# Patient Record
Sex: Male | Born: 1955 | Race: White | Hispanic: No | Marital: Married | Smoking: Former smoker
Health system: Southern US, Community
[De-identification: ages and names within clinical notes are randomized; demographics above are authoritative.]

## PROBLEM LIST (undated history)

## (undated) DIAGNOSIS — I10 Essential (primary) hypertension: Secondary | ICD-10-CM

## (undated) HISTORY — PX: COLON SURGERY: SHX602

---

## 2000-02-04 ENCOUNTER — Emergency Department (HOSPITAL_COMMUNITY): Admission: EM | Admit: 2000-02-04 | Discharge: 2000-02-04 | Payer: Self-pay | Admitting: Emergency Medicine

## 2000-02-04 ENCOUNTER — Encounter: Payer: Self-pay | Admitting: Emergency Medicine

## 2000-02-05 ENCOUNTER — Emergency Department (HOSPITAL_COMMUNITY): Admission: EM | Admit: 2000-02-05 | Discharge: 2000-02-05 | Payer: Self-pay | Admitting: Emergency Medicine

## 2015-11-24 ENCOUNTER — Ambulatory Visit: Payer: Self-pay

## 2015-11-24 ENCOUNTER — Other Ambulatory Visit: Payer: Self-pay | Admitting: Occupational Medicine

## 2015-11-24 DIAGNOSIS — M25512 Pain in left shoulder: Secondary | ICD-10-CM

## 2016-06-09 ENCOUNTER — Encounter: Payer: Self-pay | Admitting: Physician Assistant

## 2017-01-16 ENCOUNTER — Other Ambulatory Visit: Payer: Self-pay | Admitting: Urology

## 2017-01-16 DIAGNOSIS — R972 Elevated prostate specific antigen [PSA]: Secondary | ICD-10-CM

## 2017-02-03 ENCOUNTER — Ambulatory Visit
Admission: RE | Admit: 2017-02-03 | Discharge: 2017-02-03 | Disposition: A | Discharge status: Home / Self Care | Payer: Self-pay | Source: Ambulatory Visit | Attending: Urology | Admitting: Urology

## 2017-02-03 DIAGNOSIS — R972 Elevated prostate specific antigen [PSA]: Secondary | ICD-10-CM

## 2017-02-03 MED ORDER — GADOBENATE DIMEGLUMINE 529 MG/ML IV SOLN
20.0000 mL | Freq: Once | INTRAVENOUS | Status: AC | PRN
  Administered 2017-02-03: 20 mL via INTRAVENOUS

## 2017-02-07 ENCOUNTER — Other Ambulatory Visit: Payer: Self-pay | Admitting: Urology

## 2017-02-07 DIAGNOSIS — N4289 Other specified disorders of prostate: Secondary | ICD-10-CM

## 2017-02-15 ENCOUNTER — Ambulatory Visit
Admission: RE | Admit: 2017-02-15 | Discharge: 2017-02-15 | Disposition: A | Discharge status: Home / Self Care | Payer: BLUE CROSS/BLUE SHIELD | Source: Ambulatory Visit | Attending: Urology | Admitting: Urology

## 2017-02-15 ENCOUNTER — Other Ambulatory Visit (HOSPITAL_COMMUNITY): Payer: Self-pay | Admitting: Interventional Radiology

## 2017-02-15 DIAGNOSIS — N4289 Other specified disorders of prostate: Secondary | ICD-10-CM

## 2017-02-15 NOTE — H&P (Signed)
Chief Complaint: Patient was seen in consultation today for prostate lesion at the request of Eskridge,Matthew  Referring Physician(s): Eskridge,Matthew  Supervising Physician: Daryll Brod  History of Present Illness: Erik Turner is a 61 y.o. male with history of Abdominoperineal resection for rectal polyps.  MR of the prostate was obtained to evaluate for elevated PSA level.  This revealed 2.8 cm left lateral peripheral zone mass in the mid gland and base, with gross extracapsular extension, left neurovascular bundle nvolvement, and possible involvement of the left seminal vesicle. (PI-RADS category 5).  He is here today for evaluation for image guided biopsy.  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  No past medical history on file.  No past surgical history on file.  Allergies: Penicillins  Medications: Prior to Admission medications   Not on File     No family history on file.  Social History   Social History  . Marital status: Divorced    Spouse name: N/A  . Number of children: N/A  . Years of education: N/A   Social History Main Topics  . Smoking status: Not on file  . Smokeless tobacco: Not on file  . Alcohol use Not on file  . Drug use: Unknown  . Sexual activity: Not on file   Other Topics Concern  . Not on file   Social History Narrative  . No narrative on file    Review of Systems: A 12 point ROS discussed  Review of Systems  Constitutional: Negative.   HENT: Negative.   Respiratory: Negative.   Gastrointestinal: Negative.   Genitourinary: Negative.   Musculoskeletal: Negative.   Skin: Negative.   Neurological: Negative.   Hematological: Negative.   Psychiatric/Behavioral: Negative.     Vital Signs: BP 133/70 (BP Location: Left Arm, Patient Position: Sitting, Cuff Size: Large)   Pulse 76   Temp 97.8 F (36.6 C) (Oral)   Resp 14   Ht 5\' 6"  (1.676 m)   Wt 265 lb (120.2 kg)   SpO2  99%   BMI 42.77 kg/m   Physical Exam  Constitutional: He is oriented to person, place, and time. He appears well-developed.  HENT:  Head: Normocephalic and atraumatic.  Eyes: EOM are normal.  Neck: Normal range of motion.  Cardiovascular: Normal rate, regular rhythm and normal heart sounds.   Pulmonary/Chest: Effort normal and breath sounds normal. No respiratory distress. He has no wheezes.  Abdominal: Soft. He exhibits no distension. There is no tenderness.  Ileostomy  Musculoskeletal: Normal range of motion.  Neurological: He is alert and oriented to person, place, and time.  Skin: Skin is warm and dry.  Psychiatric: He has a normal mood and affect. His behavior is normal. Judgment and thought content normal.  Vitals reviewed.   Imaging: Mr Prostate W Wo Contrast  Result Date: 02/05/2017 CLINICAL DATA:  Elevated PSA level. Creatinine was obtained on site at [facility] at [address]Results: Creatinine 1.0 mg/dL. EXAM: MR PROSTATE WITHOUT AND WITH CONTRAST TECHNIQUE: Multiplanar multisequence MRI images were obtained of the pelvis centered about the prostate. Pre and post contrast images were obtained. CONTRAST:  81mL MULTIHANCE GADOBENATE DIMEGLUMINE 529 MG/ML IV SOLN COMPARISON:  None. FINDINGS: Prostate: Irregular T2 hypointense mass is seen in the left lateral peripheral zone involving the mid gland and base which measures 2.8 x 2.3 x 1.8 cm. This mass shows marked ADC hypointensity and early arterial phase enhancement. Volume: 5.4 x 4.6  x 4.4 cm (volume = 57 cm^3) Transcapsular spread: Yes, along the left posterolateral base and mid gland, which abuts the levator ani. Seminal vesicle involvement: Abuts the left anterior seminal vesicle, with possible involvement Neurovascular bundle involvement: Yes, involvement of left neurovascular bundle Pelvic adenopathy: None Bone metastasis: None Other findings: Left lower quadrant colostomy IMPRESSION: 2.8 cm left lateral  peripheral zone mass in the mid gland and base, with gross extracapsular extension, left neurovascular bundle involvement, and possible involvement of the left seminal vesicle. (PI-RADS category 5). No evidence of pelvic lymphadenopathy or bone metastases. Electronically Signed   By: Earle Gell M.D.   On: 02/05/2017 08:25    Labs:  CBC: No results for input(s): WBC, HGB, HCT, PLT in the last 8760 hours.  COAGS: No results for input(s): INR, APTT in the last 8760 hours.  BMP: No results for input(s): NA, K, CL, CO2, GLUCOSE, BUN, CALCIUM, CREATININE, GFRNONAA, GFRAA in the last 8760 hours.  Invalid input(s): CMP  LIVER FUNCTION TESTS: No results for input(s): BILITOT, AST, ALT, ALKPHOS, PROT, ALBUMIN in the last 8760 hours.  TUMOR MARKERS: No results for input(s): AFPTM, CEA, CA199, CHROMGRNA in the last 8760 hours.  Assessment:  Patient with history of APR for rectal polyps and a prostate lesion = 2.8 cm left lateral peripheral zone mass in the mid gland and base, with gross extracapsular extension, left neurovascular bundle nvolvement, and possible involvement of the left seminal vesicle.  Not a candidate for traditional trans-rectal biopsy by Urology  Will proceed with image guided biopsy.  Dr. Annamaria Boots discussed Risks and Benefits of the biopsy with the patient including, but not limited to bleeding, infection, damage to adjacent structures or low yield requiring additional tests.  All of the patient's questions were answered, patient is agreeable to proceed.  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  Electronically Signed: Murrell Redden PA-C 02/15/2017, 3:13 PM   Please refer to Dr. Fritz Pickerel attestation of this note for management and plan.

## 2017-02-27 ENCOUNTER — Encounter (HOSPITAL_COMMUNITY): Payer: Self-pay

## 2017-02-27 ENCOUNTER — Ambulatory Visit (HOSPITAL_COMMUNITY): Payer: BLUE CROSS/BLUE SHIELD

## 2017-02-28 ENCOUNTER — Other Ambulatory Visit: Payer: Self-pay | Admitting: Radiology

## 2017-03-01 ENCOUNTER — Other Ambulatory Visit: Payer: Self-pay | Admitting: General Surgery

## 2017-03-01 ENCOUNTER — Other Ambulatory Visit: Payer: Self-pay | Admitting: Student

## 2017-03-01 ENCOUNTER — Other Ambulatory Visit: Payer: Self-pay | Admitting: Radiology

## 2017-03-02 ENCOUNTER — Ambulatory Visit (HOSPITAL_COMMUNITY)
Admission: RE | Admit: 2017-03-02 | Discharge: 2017-03-02 | Disposition: A | Discharge status: Home / Self Care | Payer: BLUE CROSS/BLUE SHIELD | Source: Ambulatory Visit | Attending: Interventional Radiology | Admitting: Interventional Radiology

## 2017-03-02 ENCOUNTER — Encounter (HOSPITAL_COMMUNITY): Payer: Self-pay

## 2017-03-02 DIAGNOSIS — C61 Malignant neoplasm of prostate: Secondary | ICD-10-CM | POA: Diagnosis not present

## 2017-03-02 DIAGNOSIS — Z79899 Other long term (current) drug therapy: Secondary | ICD-10-CM | POA: Insufficient documentation

## 2017-03-02 DIAGNOSIS — N4289 Other specified disorders of prostate: Secondary | ICD-10-CM

## 2017-03-02 DIAGNOSIS — I10 Essential (primary) hypertension: Secondary | ICD-10-CM | POA: Diagnosis not present

## 2017-03-02 DIAGNOSIS — Z880 Allergy status to penicillin: Secondary | ICD-10-CM | POA: Insufficient documentation

## 2017-03-02 DIAGNOSIS — Z7982 Long term (current) use of aspirin: Secondary | ICD-10-CM | POA: Diagnosis not present

## 2017-03-02 DIAGNOSIS — Z932 Ileostomy status: Secondary | ICD-10-CM | POA: Insufficient documentation

## 2017-03-02 DIAGNOSIS — N429 Disorder of prostate, unspecified: Secondary | ICD-10-CM | POA: Diagnosis present

## 2017-03-02 HISTORY — DX: Essential (primary) hypertension: I10

## 2017-03-02 LAB — BASIC METABOLIC PANEL
Anion gap: 7 (ref 5–15)
BUN: 18 mg/dL (ref 6–20)
CALCIUM: 9.2 mg/dL (ref 8.9–10.3)
CO2: 27 mmol/L (ref 22–32)
Chloride: 106 mmol/L (ref 101–111)
Creatinine, Ser: 0.86 mg/dL (ref 0.61–1.24)
GFR calc Af Amer: >60 mL/min (ref >60)
Glucose, Bld: 110 mg/dL — ABNORMAL HIGH (ref 65–99)
POTASSIUM: 3.4 mmol/L — AB (ref 3.5–5.1)
SODIUM: 140 mmol/L (ref 135–145)

## 2017-03-02 LAB — CBC WITH DIFFERENTIAL/PLATELET
BASOS ABS: 0 10*3/uL (ref 0.0–0.1)
BASOS PCT: 0 %
EOS ABS: 0.1 10*3/uL (ref 0.0–0.7)
EOS PCT: 1 %
HCT: 44.3 % (ref 39.0–52.0)
Hemoglobin: 15.5 g/dL (ref 13.0–17.0)
LYMPHS PCT: 26 %
Lymphs Abs: 2.5 10*3/uL (ref 0.7–4.0)
MCH: 29.4 pg (ref 26.0–34.0)
MCHC: 35 g/dL (ref 30.0–36.0)
MCV: 84.1 fL (ref 78.0–100.0)
Monocytes Absolute: 0.7 10*3/uL (ref 0.1–1.0)
Monocytes Relative: 8 %
Neutro Abs: 6.2 10*3/uL (ref 1.7–7.7)
Neutrophils Relative %: 65 %
PLATELETS: 175 10*3/uL (ref 150–400)
RBC: 5.27 MIL/uL (ref 4.22–5.81)
RDW: 13.2 % (ref 11.5–15.5)
WBC: 9.5 10*3/uL (ref 4.0–10.5)

## 2017-03-02 LAB — PROTIME-INR
INR: 1
Prothrombin Time: 13.2 seconds (ref 11.4–15.2)

## 2017-03-02 MED ORDER — SODIUM CHLORIDE 0.9 % IV SOLN
INTRAVENOUS | Status: DC
  Administered 2017-03-02: 10:00:00 via INTRAVENOUS

## 2017-03-02 MED ORDER — MIDAZOLAM HCL 2 MG/2ML IJ SOLN
INTRAMUSCULAR | Status: AC | PRN
  Administered 2017-03-02 (×2): 1 mg via INTRAVENOUS

## 2017-03-02 MED ORDER — FENTANYL CITRATE (PF) 100 MCG/2ML IJ SOLN
INTRAMUSCULAR | Status: AC
  Filled 2017-03-02: qty 6

## 2017-03-02 MED ORDER — MIDAZOLAM HCL 2 MG/2ML IJ SOLN
INTRAMUSCULAR | Status: AC
  Filled 2017-03-02: qty 6

## 2017-03-02 MED ORDER — FENTANYL CITRATE (PF) 100 MCG/2ML IJ SOLN
INTRAMUSCULAR | Status: AC | PRN
  Administered 2017-03-02 (×2): 50 ug via INTRAVENOUS

## 2017-03-02 NOTE — Sedation Documentation (Signed)
Patient denies pain and is resting comfortably.  

## 2017-03-02 NOTE — Discharge Instructions (Addendum)
Transrectal Ultrasound-Guided Biopsy A transrectal ultrasound-guided biopsy is a procedure to take samples of tissue from your prostate. Ultrasound images are used to guide the procedure. It is usually done to check the prostate gland for cancer. What happens before the procedure? Do not eat or drink after midnight on the night before your procedure. Take medicines as your doctor tells you. Your doctor may have you stop taking some medicines 5-7 days before the procedure. You will be given an enema before your procedure. During an enema, a liquid is put into your butt (rectum) to clear out waste. You may have lab tests the day of your procedure. Make plans to have someone drive you home. What happens during the procedure? You will be given medicine to help you relax before the procedure. An IV tube will be put into one of your veins. It will be used to give fluids and medicine. You will be given medicine to reduce the risk of infection (antibiotic). You will be placed on your side. A probe with gel will be put in your butt. This is used to take pictures of your prostate and the area around it. A medicine to numb the area is put into your prostate. A biopsy needle is then inserted and guided to your prostate. Samples of prostate tissue are taken. The needle is removed. The samples are sent to a lab to be checked. Results are usually back in 2-3 days. What happens after the procedure? You will be taken to a room where you will be watched until you are doing okay. You may have some pain in the area around your butt. You will be given medicines for this. You may be able to go home the same day. Sometimes, an overnight stay in the hospital is needed. This information is not intended to replace advice given to you by your health care provider. Make sure you discuss any questions you have with your health care provider. Document Released: 11/22/2009 Document Revised: 05/11/2016 Document Reviewed:  07/23/2013 Elsevier Interactive Patient Education  2017 [employer]. Moderate Conscious Sedation, Adult, Care After These instructions provide you with information about caring for yourself after your procedure. Your health care provider may also give you more specific instructions. Your treatment has been planned according to current medical practices, but problems sometimes occur. Call your health care provider if you have any problems or questions after your procedure. What can I expect after the procedure? After your procedure, it is common:  To feel sleepy for several hours.  To feel clumsy and have poor balance for several hours.  To have poor judgment for several hours.  To vomit if you eat too soon. Follow these instructions at home: For at least 24 hours after the procedure:    Do not:  Participate in activities where you could fall or become injured.  Drive.  Use heavy machinery.  Drink alcohol.  Take sleeping pills or medicines that cause drowsiness.  Make important decisions or sign legal documents.  Take care of children on your own.  Rest. Eating and drinking   Follow the diet recommended by your health care provider.  If you vomit:  Drink water, juice, or soup when you can drink without vomiting.  Make sure you have little or no nausea before eating solid foods. General instructions   Have a responsible adult stay with you until you are awake and alert.  Take over-the-counter and prescription medicines only as told by your health care provider.  If  you smoke, do not smoke without supervision.  Keep all follow-up visits as told by your health care provider. This is important. Contact a health care provider if:  You keep feeling nauseous or you keep vomiting.  You feel light-headed.  You develop a rash.  You have a fever. Get help right away if:  You have trouble breathing. This information is not intended to replace advice given to you  by your health care provider. Make sure you discuss any questions you have with your health care provider. Document Released: 09/24/2013 Document Revised: 05/08/2016 Document Reviewed: 03/25/2016 Elsevier Interactive Patient Education  2017 [employer].

## 2017-03-02 NOTE — Procedures (Signed)
Left prostate nodule, s/p remote APR with colostomy  S/P CT LEFT PROSTATE BX  No comp Stable Path pending Full report in PACS

## 2017-03-02 NOTE — Sedation Documentation (Signed)
Patient is resting comfortably. 

## 2017-03-02 NOTE — H&P (Signed)
Referring Physician(s): Eskridge,M  Supervising Physician: Daryll Brod  Patient Status:  [facility]  Chief Complaint: "I'm having a prostate biopsy"   Subjective: Patient familiar to IR service from recent consultation with Dr. Annamaria Boots on 02/15/17 to discuss evaluation of a recently noted prostate mass/nodule. He is a 61 y.o. male with history of abdominoperineal resection in 1976 for rectal polyps with ileostomy creation. MRI of the prostate was obtained 02/03/17 to evaluate for elevated PSA level. This revealed a  2.8 cm left lateral peripheral zone mass in the mid gland and base, with gross extracapsular extension, left neurovascular bundle nvolvement, and possible involvement of the left seminal vesicle.(PI-RADS category 5).Due to the above noted surgery the patient cannot have the traditional transrectal prostate biopsy by urology. He was deemed an appropriate candidate for CT-guided left prostate biopsy and presents today for the procedure. He is currently asymptomatic. Past Medical History:  Diagnosis Date  . Hypertension    Past Surgical History:  Procedure Laterality Date  . COLON SURGERY     Ileostomy 1976      Allergies: Penicillins  Medications: Prior to Admission medications   Medication Sig Start Date End Date Taking? Authorizing Provider  ASPIRIN 81 PO Take 1 tablet by mouth daily.   Yes Historical Provider, MD  cholecalciferol (VITAMIN D) 1000 units tablet Take 1,000 Units by mouth daily.   Yes Historical Provider, MD  lisinopril-hydrochlorothiazide (PRINZIDE,ZESTORETIC) 20-25 MG tablet Take 1 tablet by mouth daily.   Yes Historical Provider, MD  Multiple Vitamin (MULTIVITAMIN) tablet Take 1 tablet by mouth daily.   Yes Historical Provider, MD  Multiple Vitamins-Minerals (HAIR SKIN AND NAILS FORMULA) TABS Take 1 tablet by mouth daily.   Yes Historical Provider, MD  NIFEdipine (PROCARDIA-XL/ADALAT CC) 30 MG 24 hr tablet Take 30 mg by mouth daily.   Yes Historical  Provider, MD     Vital Signs: BP 130/80 (BP Location: Left Arm)   Pulse 72   Temp 97.6 F (36.4 C) (Oral)   Resp 16   SpO2 98%   Physical Exam Awake, alert. Chest clear to auscultation bilaterally. Heart with regular rate and rhythm. Abdomen soft, positive bowel sounds, intact left lower quadrant ileostomy; no lower extremity edema  Imaging: No results found.  Labs:  CBC: No results for input(s): WBC, HGB, HCT, PLT in the last 8760 hours.  COAGS: No results for input(s): INR, APTT in the last 8760 hours.  BMP: No results for input(s): NA, K, CL, CO2, GLUCOSE, BUN, CALCIUM, CREATININE, GFRNONAA, GFRAA in the last 8760 hours.  Invalid input(s): CMP  LIVER FUNCTION TESTS: No results for input(s): BILITOT, AST, ALT, ALKPHOS, PROT, ALBUMIN in the last 8760 hours.  Assessment and Plan:  61 y.o. male with history of abdominoperineal resection in 1976 for rectal polyps with ileostomy creation. MRI of the prostate was obtained 02/03/17 to evaluate for elevated PSA level. This revealed a  2.8 cm left lateral peripheral zone mass in the mid gland and base, with gross extracapsular extension, left neurovascular bundle nvolvement, and possible involvement of the left seminal vesicle.(PI-RADS category 5).Due to the above noted surgery the patient cannot have the traditional transrectal prostate biopsy by urology. He was deemed an appropriate candidate for CT-guided left prostate biopsy and presents today for the procedure.Risks and benefits discussed with the patient including, but not limited to bleeding, infection, damage to adjacent structures or low yield requiring additional tests.All of the patient's questions were answered, patient is agreeable to proceed.Consent signed and in  chart.     Electronically Signed: D. Rowe Robert 03/02/2017, 9:39 AM   I spent a total of 20 minutes at the the patient's bedside AND on the patient's hospital floor or unit, greater than 50% of which was  counseling/coordinating care for CT-guided left prostate mass biopsy

## 2017-03-12 ENCOUNTER — Other Ambulatory Visit: Payer: Self-pay | Admitting: Urology

## 2017-03-12 DIAGNOSIS — C61 Malignant neoplasm of prostate: Secondary | ICD-10-CM

## 2017-03-23 ENCOUNTER — Encounter (HOSPITAL_COMMUNITY)
Admission: RE | Admit: 2017-03-23 | Discharge: 2017-03-23 | Disposition: A | Discharge status: Home / Self Care | Payer: BLUE CROSS/BLUE SHIELD | Source: Ambulatory Visit | Attending: Urology | Admitting: Urology

## 2017-03-23 DIAGNOSIS — C61 Malignant neoplasm of prostate: Secondary | ICD-10-CM | POA: Insufficient documentation

## 2017-03-23 MED ORDER — TECHNETIUM TC 99M MEDRONATE IV KIT
18.6500 | PACK | Freq: Once | INTRAVENOUS | Status: AC | PRN
  Administered 2017-03-23: 18.65 via INTRAVENOUS

## 2017-04-02 ENCOUNTER — Telehealth: Payer: Self-pay | Admitting: *Deleted

## 2017-04-16 ENCOUNTER — Telehealth: Payer: Self-pay | Admitting: Medical Oncology

## 2017-04-16 NOTE — Progress Notes (Signed)
Left a message requesting a return call to discuss referral to the Prostate [facility]. 

## 2017-04-26 ENCOUNTER — Telehealth: Payer: Self-pay | Admitting: Medical Oncology

## 2017-04-26 DIAGNOSIS — C61 Malignant neoplasm of prostate: Secondary | ICD-10-CM | POA: Insufficient documentation

## 2017-04-26 NOTE — Progress Notes (Signed)
GU Location of Tumor / Histology: prostatic adenocarcinoma  If Prostate Cancer, Gleason Score is (4 + 3) and PSA is (8.93) on 02/19/17 with positive MRI demonstrating a left lateral prostate nodule.   Erik Turner presented  months ago with signs/symptoms of:   Biopsies of prostate (if applicable) revealed:    Past/Anticipated interventions by urology, if any: CT guided left prostate biopsy  Past/Anticipated interventions by medical oncology, if any: no  Weight changes, if any: no  Bowel/Bladder complaints, if any:    Nausea/Vomiting, if any: no  Pain issues, if any:    SAFETY ISSUES:  Prior radiation?   Pacemaker/ICD?   Possible current pregnancy? no  Is the patient on methotrexate?   Current Complaints / other details:  61 year old. Divorced. Ax: penicillin. Prior APR for rectal polyps and now has a colostomy thus a traditional transrectal prostate biopsy couldn't be done.  Patient did not complete PMDC packet thus, abstract couldn't not be completed.

## 2017-04-26 NOTE — Progress Notes (Signed)
Attempted to reach patient regarding appointment for Prostate [facility] 04/27/17. His voice mailbox if full.

## 2017-04-27 ENCOUNTER — Encounter: Payer: Self-pay | Admitting: General Practice

## 2017-04-27 ENCOUNTER — Ambulatory Visit
Admission: RE | Admit: 2017-04-27 | Discharge: 2017-04-27 | Disposition: A | Discharge status: Home / Self Care | Payer: BLUE CROSS/BLUE SHIELD | Source: Ambulatory Visit | Attending: Radiation Oncology | Admitting: Radiation Oncology

## 2017-04-27 ENCOUNTER — Encounter: Payer: Self-pay | Admitting: Medical Oncology

## 2017-04-27 ENCOUNTER — Ambulatory Visit (HOSPITAL_BASED_OUTPATIENT_CLINIC_OR_DEPARTMENT_OTHER): Payer: BLUE CROSS/BLUE SHIELD | Admitting: Oncology

## 2017-04-27 DIAGNOSIS — Z510 Encounter for antineoplastic radiation therapy: Secondary | ICD-10-CM | POA: Insufficient documentation

## 2017-04-27 DIAGNOSIS — Z8249 Family history of ischemic heart disease and other diseases of the circulatory system: Secondary | ICD-10-CM | POA: Insufficient documentation

## 2017-04-27 DIAGNOSIS — C61 Malignant neoplasm of prostate: Secondary | ICD-10-CM | POA: Diagnosis not present

## 2017-04-27 DIAGNOSIS — Z79899 Other long term (current) drug therapy: Secondary | ICD-10-CM | POA: Diagnosis not present

## 2017-04-27 DIAGNOSIS — Z86718 Personal history of other venous thrombosis and embolism: Secondary | ICD-10-CM | POA: Diagnosis not present

## 2017-04-27 DIAGNOSIS — I10 Essential (primary) hypertension: Secondary | ICD-10-CM | POA: Diagnosis not present

## 2017-04-27 DIAGNOSIS — Z87891 Personal history of nicotine dependence: Secondary | ICD-10-CM | POA: Insufficient documentation

## 2017-04-27 DIAGNOSIS — Z932 Ileostomy status: Secondary | ICD-10-CM | POA: Insufficient documentation

## 2017-04-27 DIAGNOSIS — Z7982 Long term (current) use of aspirin: Secondary | ICD-10-CM | POA: Insufficient documentation

## 2017-04-27 DIAGNOSIS — Z9049 Acquired absence of other specified parts of digestive tract: Secondary | ICD-10-CM | POA: Diagnosis not present

## 2017-04-27 DIAGNOSIS — Z800 Family history of malignant neoplasm of digestive organs: Secondary | ICD-10-CM | POA: Insufficient documentation

## 2017-04-27 NOTE — Progress Notes (Signed)
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 He was noted to have PSA elevation of 7.7 and was referred to Dr. Junious Silk for an evaluation. He underwent a transperineal biopsy obtained on 03/02/2017 which showed a Gleason score 4+3 = 7 with 85% of the cores involved with malignancy. He does not report any major voiding symptoms at this time. He does not report any hematuria or dysuria. He has a good stream with occasional urgency. He denied any arthralgias or myalgias. His performance status and activity level remains at baseline.   He is not reporting any headaches or blurry vision, syncope or seizures. He does not report any fevers, chills or sweats. He is not report any cough, wheezing or hemoptysis. He does not report any nausea, vomiting or abdominal pain. He does not report any skeletal complaints. Remaining review of systems unremarkable.   Past Medical History:  Diagnosis Date  . Hypertension   :  Past Surgical History:  Procedure Laterality Date  . COLON SURGERY     Ileostomy 1976  :   Current Outpatient Prescriptions:  .  ASPIRIN 81 PO, Take 1 tablet by mouth daily., Disp: , Rfl:  .  cholecalciferol (VITAMIN D) 1000 units tablet, Take 1,000 Units by mouth daily., Disp: , Rfl:  .  lisinopril-hydrochlorothiazide (PRINZIDE,ZESTORETIC) 20-25 MG tablet, Take 1 tablet by mouth daily., Disp: , Rfl:  .  Multiple Vitamin (MULTIVITAMIN) tablet, Take 1 tablet by mouth daily., Disp: , Rfl:  .  Multiple Vitamins-Minerals (HAIR SKIN AND NAILS FORMULA) TABS, Take 1 tablet by mouth daily., Disp: , Rfl:  .  NIFEdipine (PROCARDIA-XL/ADALAT CC) 30 MG 24 hr tablet, Take 30 mg by mouth daily., Disp: , Rfl: :  Allergies  Allergen Reactions  . Penicillins     Patient states that all  family members are allergic to penicillin & that he has been told by another physician to avoid penicillin.   :  No family history on file.:  Social History   Social History  . Marital status: Married    Spouse name: N/A  . Number of children: N/A  . Years of education: N/A   Occupational History  . Not on file.   Social History Main Topics  . Smoking status: Former Smoker    Types: Cigarettes    Quit date: 57  . Smokeless tobacco: Former Systems developer  . Alcohol use 0.6 oz/week    1 Shots of liquor per week     Comment: 3-4 months  . Drug use: No  . Sexual activity: Not on file   Other Topics Concern  . Not on file   Social History Narrative  . No narrative on file  :  Pertinent items are noted in HPI.  Exam: ECOG 0 General appearance: alert and cooperative appeared without distress. Throat: lips, mucosa, and tongue normal; teeth and gums normal Neck: no adenopathy Back: negative Resp: clear to auscultation bilaterally Chest wall: no tenderness Cardio: regular rate and rhythm, S1, S2 normal, no murmur, click, rub or gallop GI: soft, non-tender; bowel sounds normal; no masses,  no organomegaly Extremities: extremities normal, atraumatic, no cyanosis or edema Pulses: 2+ and symmetric  CBC    Component Value Date/Time   WBC 9.5 03/02/2017 0924   RBC 5.27 03/02/2017 0924   HGB 15.5 03/02/2017  0924   HCT 44.3 03/02/2017 0924   PLT 175 03/02/2017 0924   MCV 84.1 03/02/2017 0924   MCH 29.4 03/02/2017 0924   MCHC 35.0 03/02/2017 0924   RDW 13.2 03/02/2017 0924   LYMPHSABS 2.5 03/02/2017 0924   MONOABS 0.7 03/02/2017 0924   EOSABS 0.1 03/02/2017 0924   BASOSABS 0.0 03/02/2017 0924     Chemistry      Component Value Date/Time   NA 140 03/02/2017 0924   K 3.4 (L) 03/02/2017 0924   CL 106 03/02/2017 0924   CO2 27 03/02/2017 0924   BUN 18 03/02/2017 0924   CREATININE 0.86 03/02/2017 0924      Component Value Date/Time   CALCIUM 9.2 03/02/2017 0924        Assessment and Plan:   61 year old gentleman with prostate cancer diagnosed in March 2018 after presenting with a PSA of 7.7 and a prostate biopsy showed Gleason score 4+3 = 7.  His case was discussed today and the prostate cancer multidisciplinary clinic. His pathology was reviewed with the reviewing pathologist. Imaging studies including MRI of the prostate as well as bone scan were discussed with radiology. His imaging studies do not suggest any evidence of advanced disease.  The natural course of this disease was discussed with the patient today and treatment options were reviewed. These would include primary surgical therapy versus radiation therapy with androgen deprivation. The logistics of these approaches were reviewed today as well as long term outcomes. He understands that right now is his best chance to definitively treat this cancer and both options or equivalent. Failure to do so, he is at risk of advanced disease and at that time treatment options are available but not curative.  He will review these options awake a decision in the near future.

## 2017-04-27 NOTE — Consult Note (Signed)
Multi-Disciplinary Clinic 04/27/2017    Erik Turner         MRN: [id number]  PRIMARY CARE:  Erik School. Millsaps, NP  DOB: 1956/07/17, 61 year old Male  REFERRING:  Erik Turner, [place]  SSN: [id number]  PROVIDER:  Festus Turner, M.D.    TREATING:  Erik Turner, M.D.    LOCATION:  [facility] [phone]    CC/HPI: CC: Prostate Cancer   Physician requesting consult: Dr. Eda Turner  PCP: Erik Beals, NP  Location of consult: [facility] - Prostate Cancer Multidisciplinary Clinic   Erik Turner is a 61 year old gentleman with a history of an abdominoperineal resection with ileostomy creation in 1976 at age 59. This was reportedly due to family adenomatous polyposis (sister and mother with colon cancer). He was noted to have a rising PSA to 8.93. Due to his inability to undergo a rectal exam, he underwent evaluation with an MRI of the prostate on 02/03/17 that demonstrated a 2.8 x 2.3 x 1.8 cm left lateral peripheral zone PI-RADS 5 lesion with evidence of left extraprostatic extension and neurovascular bundle involvement and left seminal vesicle involvement. He then underwent a percutaneous CT-guided biopsy of the prostate on 03/02/17 by Dr. Annamaria Turner that confirmed Gleason 4+3=7 adenocarcinoma with 85% of the biopsied tissue positive for malignancy.   Family history: None.   Imaging studies:  MRI prostate (02/03/17) - Probable left EPE and left SVI, no LAD  Bone scan (03/23/17) - Negative   PMH: He has a history of hypertension, DVT, and FAP. His DVT occurred after a fall from a ladder.  PSH: APR with ileostomy in 1976, Revision of ileostomy in 1986   TNM stage: cT3b N0 M0  PSA: 8.93  Gleason score: 4+3=7  Biopsy (03/02/17): Gleason 4+3=7, 85% of tissue positive  Left: N/A  Right: N/A  Prostate volume: 57 cc (per MRI)   Urinary function: IPSS is 13.  Erectile function: SHIM score is 22.     ALLERGIES: Penicillin    MEDICATIONS: Aspirin 81 mg  tablet, chewable  Amlodipine Besylate 10 mg tablet  Lisinopril-Hydrochlorothiazide 20 mg-25 mg tablet  Vitamin D3     GU PSH: Vasectomy      PSH Notes: ileostomy 1976   NON-GU PSH: None         GU PMH: Elevated PSA - 12/25/2016, - 09/18/2016       PMH Notes: lt leg thrombus, colon polyps, revision of ileostomy 1986    NON-GU PMH: Acute embolism and thrombosis of unspecified deep veins of right lower extremity Hypertension Malignant neoplasm of colon, unspecified     FAMILY HISTORY: Colon Cancer - Sister, Mother Heart Attack - Father    SOCIAL HISTORY: Marital Status: Married Current Smoking Status: Patient does not smoke anymore.  Social Drinker.  Drinks 2 caffeinated drinks per day. Patient's occupation Animator.      Notes: No children    REVIEW OF SYSTEMS:     GU Review Male:  Patient denies frequent urination, hard to postpone urination, burning/ pain with urination, get up at night to urinate, leakage of urine, stream starts and stops, trouble starting your streams, and have to strain to urinate .    Gastrointestinal (Lower):  Patient denies diarrhea and constipation.    Gastrointestinal (Upper):  Patient denies nausea and vomiting.    Constitutional:  Patient denies fever, night sweats, weight loss, and fatigue.    Skin:  Patient denies  skin rash/ lesion and itching.    Eyes:  Patient denies blurred vision and double vision.    Ears/ Nose/ Throat:  Patient denies sore throat and sinus problems.    Hematologic/Lymphatic:  Patient denies swollen glands and easy bruising.    Cardiovascular:  Patient denies chest pains and leg swelling.    Respiratory:  Patient denies cough and shortness of breath.    Endocrine:  Patient denies excessive thirst.    Musculoskeletal:  Patient denies back pain and joint pain.    Neurological:  Patient denies headaches and dizziness.    Psychologic:  Patient denies depression and anxiety.    VITAL SIGNS: None    MULTI-SYSTEM  PHYSICAL EXAMINATION:     Constitutional: Well-nourished. No physical deformities. Normally developed. Good grooming.    Neck: Neck symmetrical, not swollen. Normal tracheal position.    Respiratory: No labored breathing, no use of accessory muscles.     Cardiovascular: Normal temperature, normal extremity pulses, no swelling, no varicosities.    Lymphatic: No enlargement of neck, axillae, groin.    Skin: No paleness, no jaundice, no cyanosis. No lesion, no ulcer, no rash.    Neurologic / Psychiatric: Oriented to time, oriented to place, oriented to person. No depression, no anxiety, no agitation.    Gastrointestinal: No mass, no tenderness, no rigidity. He is moderately obese. He has a left abdominal ileostomy. He has a well healed midline abdominal scar down to his pubis.     Eyes: Normal conjunctivae. Normal eyelids.    Ears, Nose, Mouth, and Throat: Left ear no scars, no lesions, no masses. Right ear no scars, no lesions, no masses. Nose no scars, no lesions, no masses. Normal hearing. Normal lips.    Musculoskeletal: Normal gait and station of head and neck.          PAST DATA REVIEWED:   Source Of History:  Patient  Lab Test Review:  PSA  Records Review:  Pathology Reports, Previous Patient Records  X-Ray Review: C.T. Abdomen/Pelvis: Reviewed Films.  Bone Scan: Reviewed Films.      02/19/17 12/25/16 09/18/16  PSA  Total PSA 8.93 ng/dl 7.15 ng/dl 7.25   Free PSA 1.39 ng/dl 1.21 ng/dl 1.14   % Free PSA 16 % 17 % 16     PROCEDURES: None   ASSESSMENT:     ICD-10 Details  1 GU:  Prostate Cancer - C61    PLAN:   Document  Letter(s):  Created for Patient: Clinical Summary   Notes:  1. Prostate cancer: I had a detailed discussion with [name] today regarding his options for treatment. I did recommend therapy of curative intent. He understands that he does have locally advanced disease but without evidence of obvious metastatic disease. We discussed treatment options in the  context of his prior complex surgical history.   The patient was counseled about the natural history of prostate cancer and the standard treatment options that are available for prostate cancer. It was explained to him how his age and life expectancy, clinical stage, Gleason score, and PSA affect his prognosis, the decision to proceed with additional staging studies, as well as how that information influences recommended treatment strategies. We discussed the roles for active surveillance, radiation therapy, surgical therapy, androgen deprivation, as well as ablative therapy options for the treatment of prostate cancer as appropriate to his individual cancer situation. We discussed the risks and benefits of these options with regard to their impact on cancer control and also in terms  of potential adverse events, complications, and impact on quality of life particularly related to urinary and sexual function. The patient was encouraged to ask questions throughout the discussion today and all questions were answered to his stated satisfaction. In addition, the patient was provided with and/or directed to appropriate resources and literature for further education about prostate cancer and treatment options.   We discussed surgical therapy for prostate cancer including the different available surgical approaches. We discussed, in detail, the risks and expectations of surgery with regard to cancer control, urinary control, and erectile function as well as the expected postoperative recovery process. Additional risks of surgery including but not limited to bleeding, infection, hernia formation, nerve damage, lymphocele formation, bowel/rectal injury potentially necessitating colostomy, damage to the urinary tract resulting in urine leakage, urethral stricture, and the cardiopulmonary risks such as myocardial infarction, stroke, death, venothromboembolism, etc. were explained. The risk of open surgical conversion for  robotic/laparoscopic prostatectomy was also discussed.   If he did undergo surgery, he understands that this would require an open radical retropubic prostatectomy. We discussed this option and the expected recovery process as well as the potential complications, etc. We compare this with the option of primary radiation therapy with Turner-term androgen deprivation. He would understand that he would be at an increased risk for DVT with surgery considering his prior history of a DVT. He understands it may take a little bit longer to recover from surgery considering the potential complexity.   Ultimately, he wishes to proceed with primary radiation therapy and Turner-term androgen deprivation. I will notify Dr. Junious Silk of his decision. He has met with both Dr. Alen Blew and Dr. Tammi Klippel earlier this morning.    Cc: Dr. Eda Turner  Dr. Zola Button  Dr. [name]  Erik Beals, NP         E & M CODE: I spent at least 60 minutes face to face with the patient, more than 50% of that time was spent on counseling and/or coordinating care.

## 2017-04-27 NOTE — Progress Notes (Signed)
                               Care Plan Summary  Name: Mr. Navid Lenzen DOB: [birth year]   Your Medical Team:   Urologist -  Dr. Raynelle Bring, [facility]  Radiation Oncologist - Dr. [name], [facility]   Medical Oncologist - Dr. Zola Button, [place]  Recommendations: 1) Androgen Deprivation (hormone therapy) 2) Radiation 3) Open prostatectomy  * These recommendations are based on information available as of today's consult.      Recommendations may change depending on the results of further tests or exams.  Next Steps 1) Dr. Lyndal Rainbow office will schedule  hormone injection 2) Dr. Johny Shears office will schedule radiation planning and treatments.  When appointments need to be scheduled, you will be contacted by [facility] and/or [facility].  Questions?  Please do not hesitate to call Cira Rue, RN, BSN, OCN at [phone] any questions or concerns.  Shirlean Mylar is your Oncology Nurse Navigator and is available to assist you while you're receiving your medical care at [facility].  Patient given a folder with all team members business cards and a copy of "[facility] Prevention Safety".

## 2017-04-27 NOTE — Progress Notes (Signed)
Radiation Oncology         [phone] ________________________________  Initial outpatient Consultation  Name: Erik Turner MRN: [id number]  Date: 04/27/2017  DOB: 06-09-1956  DG:LOVFIEPP, Joelene Millin, NP  Raynelle Bring, MD   REFERRING PHYSICIAN: Raynelle Bring, MD  DIAGNOSIS: 61 y.o. gentleman s/p remote APR for familial polyposis now with Stage T3b adenocarcinoma of the prostate with Gleason Score of 4+3, and PSA of 8.93.    ICD-9-CM ICD-10-CM   1. Malignant neoplasm of prostate ([place]) 185 C61     HISTORY OF PRESENT ILLNESS: Erik Turner is a 61 y.o. male with a diagnosis of prostate cancer. He was noted to have an elevated PSA of 7.7 by his primary care provider, [place], [place].  Accordingly, he was referred for evaluation in urology by Dr. Junious Silk on 09/18/2016,  digital rectal examination was unable to be performed at that time due to prior APR for FAP. A repeat PSA ws performed on 09/18/17 which was slightly improved at 7.25.  Repeat PSA on 12/25/16 was 7.15 and 8.93 on 02/19/17.  MRI of the prostate was performed on 02/03/2017. This showed a hypointense mass in the left lateral peripheral zone involving the mid gland and base measuring 2.8 x 2.3 x 1.8 cm with gross extracapsular extension, left neurovascular bundle involvement, and possible involvement of the left seminal vesicle. 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 He had revision of the ileostomy in 1986.  Therefore a traditional transrectal prostate biopsy could not be performed. The patient underwent CT guided biopsy of the left sided prostate nodule on 03/02/17 and this revealed prostatic adenocarcinoma, Gleason's score 4+3=7, involving 85% of the prostate tissue.   Bone scan on 03/23/17 showed no evidence of metastatic disease.  The patient reviewed the biopsy results with his urologist and he has kindly been referred today for discussion of potential radiation  treatment options.  PSA 02/19/2017: 8.93 12/25/2016: 7.15 09/18/2016: 7.25   PREVIOUS RADIATION THERAPY: No  PAST MEDICAL HISTORY:  Past Medical History:  Diagnosis Date  . Hypertension       PAST SURGICAL HISTORY: Past Surgical History:  Procedure Laterality Date  . COLON SURGERY     Ileostomy 1976    FAMILY HISTORY: No family history on file.  SOCIAL HISTORY:  Patient is a long-distance Administrator Social History   Social History  . Marital status: Married    Spouse name: N/A  . Number of children: N/A  . Years of education: N/A   Occupational History  . Not on file.   Social History Main Topics  . Smoking status: Former Smoker    Types: Cigarettes    Quit date: 45  . Smokeless tobacco: Former Systems developer  . Alcohol use 0.6 oz/week    1 Shots of liquor per week     Comment: 3-4 months  . Drug use: No  . Sexual activity: Not on file   Other Topics Concern  . Not on file   Social History Narrative  . No narrative on file    ALLERGIES: Penicillins  MEDICATIONS:  Current Outpatient Prescriptions  Medication Sig Dispense Refill  . ASPIRIN 81 PO Take 1 tablet by mouth daily.    . cholecalciferol (VITAMIN D) 1000 units tablet Take 1,000 Units by mouth daily.    Marland Kitchen lisinopril-hydrochlorothiazide (PRINZIDE,ZESTORETIC) 20-25 MG tablet Take 1 tablet by mouth daily.    . Multiple Vitamin (MULTIVITAMIN) tablet  Take 1 tablet by mouth daily.    . Multiple Vitamins-Minerals (HAIR SKIN AND NAILS FORMULA) TABS Take 1 tablet by mouth daily.    Marland Kitchen NIFEdipine (PROCARDIA-XL/ADALAT CC) 30 MG 24 hr tablet Take 30 mg by mouth daily.     No current facility-administered medications for this encounter.     REVIEW OF SYSTEMS:  On review of systems, the patient reports that he is doing well overall. He denies any chest pain, shortness of breath, cough, fevers, chills, night sweats, unintended weight changes. He denies any bowel disturbances, and denies abdominal pain, nausea or  vomiting. He denies any new musculoskeletal or joint aches or pains. His IPSS was 13, indicating moderate urinary symptoms. He is able to complete sexual activity with most attempts. A complete review of systems is obtained and is otherwise negative.    PHYSICAL EXAM:  Wt Readings from Last 3 Encounters:  04/27/17 260 lb 3.2 oz (118 kg)  02/15/17 265 lb (120.2 kg)   Temp Readings from Last 3 Encounters:  03/02/17 97.8 F (36.6 C) (Oral)  02/15/17 97.8 F (36.6 C) (Oral)   BP Readings from Last 3 Encounters:  04/27/17 (!) 159/73  03/02/17 128/68  02/15/17 133/70   Pulse Readings from Last 3 Encounters:  04/27/17 67  03/02/17 (!) 55  02/15/17 76    In general this is a well appearing caucasian male in no acute distress. He is alert and oriented x4 and appropriate throughout the examination. HEENT reveals that the patient is normocephalic, atraumatic. EOMs are intact. PERRLA. Skin is intact without any evidence of gross lesions. Cardiovascular exam reveals a regular rate and rhythm, no clicks rubs or murmurs are auscultated. Chest is clear to auscultation bilaterally. Lymphatic assessment is performed and does not reveal any adenopathy in the cervical, supraclavicular, axillary, or inguinal chains. Abdomen has active bowel sounds in all quadrants and is intact. The abdomen is soft, non tender, non distended. There is an intact ileostomy without signs of infection and appears to be draining appropriately. Lower extremities are negative for deep calf tenderness, cyanosis or clubbing.  There is 1+ pitting edema on the left, no edema noted on the right.   KPS = 100  100 - Normal; no complaints; no evidence of disease. 90   - Able to carry on normal activity; minor signs or symptoms of disease. 80   - Normal activity with effort; some signs or symptoms of disease. 69   - Cares for self; unable to carry on normal activity or to do active work. 60   - Requires occasional assistance, but is  able to care for most of his personal needs. 50   - Requires considerable assistance and frequent medical care. 35   - Disabled; requires special care and assistance. 69   - Severely disabled; hospital admission is indicated although death not imminent. 61   - Very sick; hospital admission necessary; active supportive treatment necessary. 10   - Moribund; fatal processes progressing rapidly. 0     - Dead  Karnofsky DA, Abelmann [place], Craver LS and Burchenal [facility] [phone]) The use of the nitrogen mustards in the palliative treatment of carcinoma: with particular reference to bronchogenic carcinoma Cancer 1 634-56  LABORATORY DATA:  Lab Results  Component Value Date   WBC 9.5 03/02/2017   HGB 15.5 03/02/2017   HCT 44.3 03/02/2017   MCV 84.1 03/02/2017   PLT 175 03/02/2017   Lab Results  Component Value Date   NA 140 03/02/2017  K 3.4 (L) 03/02/2017   CL 106 03/02/2017   CO2 27 03/02/2017   No results found for: ALT, AST, GGT, ALKPHOS, BILITOT   RADIOGRAPHY: No results found.    IMPRESSION/PLAN: 1. 61 y.o. gentleman s/p remote APR with Stage T3b adenocarcinoma of the prostate with Gleason Score of 4+3, and PSA of 8.93.  MRI prostate demonstrated gross ECE, left neurovascular involvement and possible involvement of the left seminal vesicle.  Today, we talked to the patient about the findings and workup thus far. We discussed the natural history of high grade, locally advanced adenocarcinoma of the prostate and general treatment, highlighting the role of radiotherapy in the management. We discussed the available radiation techniques, and focused on the details of logistics and delivery.  We discussed the role of ADT in combination with IMRT in the treatment of prostate cancer.  We reviewed the side effects associated with ADT as well as the anticipated acute and late sequelae associated with radiation in this setting and compared and contrasted these with prostatectomy. The patient's previous  history of APR with ileostomy and revision may present greater challenges for prostatectomy.The patient was encouraged to ask questions that were answered to his satisfaction. We discussed our recommendation for an 8 week course of IMRT in combination with 2 years of ADT.   The patient is interested in moving forward with LT-ADT in combination with IMRT.  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  We will share our findings with Dr. Alinda Money and make arrangements for the patient to start ADT in May with anticipation of beginning IMRT in late July or early August, after completing the first 2 months of ADT.  We will schedule CT Simulation to occur in mid-late July.  The patient will not need a catheter for urethrogram since he has had prostate MRI which will be fused with his CT Sim for planning purposes.  We enjoyed meeting with this nice gentleman today and look forward to participating in his care in the near future.   Nicholos Johns, PA-C    [name], MD  [facility] Direct Dial: [phone]  Fax: [phone] [email or web address]  Skype  LinkedIn  This document serves as a record of services personally performed by Freeman Caldron, PA-C and [name], MD. It was created on their behalf by Darcus Austin, a trained medical scribe. The creation of this record is based on the scribe's personal observations and the providers' statements to them. This document has been checked and approved by the attending provider.

## 2017-04-30 NOTE — Progress Notes (Signed)
[place] Psychosocial Distress Screening Spiritual Care  Met with Kashif in [facility] to introduce [place] team/resources, reviewing distress screen per protocol.  The patient scored a 5 on the Psychosocial Distress Thermometer which indicates moderate distress. Also assessed for distress and other psychosocial needs.   ONCBCN DISTRESS SCREENING 04/30/2017  Screening Type Initial Screening  Distress experienced in past week (1-10) 5  Physical Problem type Tingling hands/feet  Referral to support programs Yes   Mr Stangelo was very talkative and engaging during this encounter.  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  He enjoys cooking, old movies, and the satisfaction/relief at mowing his lawn (2 acres).    Follow up needed: No.  At this time, pt states that he has no other needs or concerns.  He knows to contact Support Team as needed/desired.   [place], [place], [facility] Pager [phone] Voicemail [phone]

## 2017-05-07 ENCOUNTER — Telehealth: Payer: Self-pay | Admitting: Medical Oncology

## 2017-05-07 NOTE — Progress Notes (Signed)
Left a message as follow up to Prostate MDC. I asked him to call me if he has not gotten an appointment for his hormone injection.

## 2017-05-11 ENCOUNTER — Encounter: Payer: Self-pay | Admitting: Urology

## 2017-05-11 NOTE — Progress Notes (Signed)
Patient is scheduled to begin ADT on 05/21/17 per [facility].

## 2017-05-21 ENCOUNTER — Encounter: Payer: Self-pay | Admitting: Medical Oncology

## 2017-05-22 ENCOUNTER — Encounter: Payer: Self-pay | Admitting: Urology

## 2017-05-22 NOTE — Progress Notes (Signed)
Inbox message forwarded to Erik Turner to move forward with scheduling CT Sim prostate for IMRT in the next 6-7 weeks. Patient had Lupron with Dr. Junious Silk on 05/21/17.

## 2017-06-25 ENCOUNTER — Telehealth: Payer: Self-pay | Admitting: *Deleted

## 2017-06-25 ENCOUNTER — Telehealth: Payer: Self-pay | Admitting: Medical Oncology

## 2017-06-25 NOTE — Telephone Encounter (Signed)
CALLED PATIENT TO INFORM OF SIM APPT. FOR 07-13-17 @ 1 PM @ DR. MANNING'S OFFICE, SPOKE WITH PATIENT AND HE IS AWARE OF THIS APPT.

## 2017-06-25 NOTE — Telephone Encounter (Signed)
Followed up with Erik Turner regarding appointment for CT simulation. He states Enid Derry called him and he has appointment 07/13/17 at 1 pm. He asked about time of day he will be treated. I explained that the therapist will ask him what he desires the day of CT simulation. He voiced understanding.

## 2017-06-25 NOTE — Telephone Encounter (Signed)
Erik Turner called asking when he would be scheduled for his radiation. He received his Lupron injection 05/21/17. He would like to plan a week of vacation. I will follow up with Enid Derry and give him a call back with an update.

## 2017-07-06 ENCOUNTER — Ambulatory Visit
Admission: RE | Admit: 2017-07-06 | Discharge: 2017-07-06 | Disposition: A | Discharge status: Home / Self Care | Payer: BLUE CROSS/BLUE SHIELD | Source: Ambulatory Visit | Attending: Radiation Oncology | Admitting: Radiation Oncology

## 2017-07-06 ENCOUNTER — Encounter: Payer: Self-pay | Admitting: Medical Oncology

## 2017-07-06 DIAGNOSIS — Z510 Encounter for antineoplastic radiation therapy: Secondary | ICD-10-CM | POA: Diagnosis not present

## 2017-07-06 DIAGNOSIS — C61 Malignant neoplasm of prostate: Secondary | ICD-10-CM

## 2017-07-06 NOTE — Progress Notes (Signed)
Erik Turner states he is doing well. 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 He states while being on vacation he may loose weight and asked if this would affect his treatment. The therapist informed him if he looses more than 10 pounds his plan may be to be reevaluated and he voiced understanding.

## 2017-07-06 NOTE — Progress Notes (Signed)
  Radiation Oncology         [phone] ________________________________  Name: Erik Turner MRN: [id number]  Date: 07/06/2017  DOB: 06/18/1956  SIMULATION AND TREATMENT PLANNING NOTE    ICD-10-CM   1. Malignant neoplasm of prostate ([place]) C61     DIAGNOSIS:  61 y.o. gentleman s/p remote APR for familial polyposis now with Stage T3b adenocarcinoma of the prostate with Gleason Score of 4+3, and PSA of 8.93.   NARRATIVE:  The patient was brought to the [facility].  Identity was confirmed.  All relevant records and images related to the planned course of therapy were reviewed.  The patient freely provided informed written consent to proceed with treatment after reviewing the details related to the planned course of therapy. The consent form was witnessed and verified by the simulation staff.  Then, the patient was set-up in a stable reproducible supine position for radiation therapy.  A vacuum lock pillow device was custom fabricated to position his legs in a reproducible immobilized position.  Then, I performed a urethrogram under sterile conditions to identify the prostatic apex.  CT images were obtained.  Surface markings were placed.  The CT images were loaded into the planning software.  Then the prostate target and avoidance structures including the rectum, bladder, bowel and hips were contoured.  Treatment planning then occurred.  The radiation prescription was entered and confirmed.  A total of one complex treatment devices were fabricated. I have requested : Intensity Modulated Radiotherapy (IMRT) is medically necessary for this case for the following reason:  Rectal sparing.Marland Kitchen  PLAN:  The patient will receive 45 Gy in 25 fractions of 1.8 Gy, followed by a boost to the prostate to a total dose of 75 Gy with 15 additional fractions of 2.0 Gy.  ________________________________  Sheral Apley Tammi Klippel, M.D.

## 2017-07-12 DIAGNOSIS — Z510 Encounter for antineoplastic radiation therapy: Secondary | ICD-10-CM | POA: Diagnosis not present

## 2017-07-13 ENCOUNTER — Ambulatory Visit: Payer: BLUE CROSS/BLUE SHIELD | Admitting: Radiation Oncology

## 2017-07-17 ENCOUNTER — Ambulatory Visit
Admission: RE | Admit: 2017-07-17 | Discharge: 2017-07-17 | Disposition: A | Discharge status: Home / Self Care | Payer: BLUE CROSS/BLUE SHIELD | Source: Ambulatory Visit | Attending: Radiation Oncology | Admitting: Radiation Oncology

## 2017-07-17 DIAGNOSIS — Z510 Encounter for antineoplastic radiation therapy: Secondary | ICD-10-CM | POA: Diagnosis not present

## 2017-07-18 ENCOUNTER — Ambulatory Visit
Admission: RE | Admit: 2017-07-18 | Discharge: 2017-07-18 | Disposition: A | Discharge status: Home / Self Care | Payer: BLUE CROSS/BLUE SHIELD | Source: Ambulatory Visit | Attending: Radiation Oncology | Admitting: Radiation Oncology

## 2017-07-18 DIAGNOSIS — Z510 Encounter for antineoplastic radiation therapy: Secondary | ICD-10-CM | POA: Diagnosis not present

## 2017-07-19 ENCOUNTER — Ambulatory Visit
Admission: RE | Admit: 2017-07-19 | Discharge: 2017-07-19 | Disposition: A | Discharge status: Home / Self Care | Payer: BLUE CROSS/BLUE SHIELD | Source: Ambulatory Visit | Attending: Radiation Oncology | Admitting: Radiation Oncology

## 2017-07-19 DIAGNOSIS — Z510 Encounter for antineoplastic radiation therapy: Secondary | ICD-10-CM | POA: Diagnosis not present

## 2017-07-20 ENCOUNTER — Ambulatory Visit
Admission: RE | Admit: 2017-07-20 | Discharge: 2017-07-20 | Disposition: A | Discharge status: Home / Self Care | Payer: BLUE CROSS/BLUE SHIELD | Source: Ambulatory Visit | Attending: Radiation Oncology | Admitting: Radiation Oncology

## 2017-07-20 DIAGNOSIS — Z510 Encounter for antineoplastic radiation therapy: Secondary | ICD-10-CM | POA: Diagnosis not present

## 2017-07-23 ENCOUNTER — Ambulatory Visit
Admission: RE | Admit: 2017-07-23 | Discharge: 2017-07-23 | Disposition: A | Discharge status: Home / Self Care | Payer: BLUE CROSS/BLUE SHIELD | Source: Ambulatory Visit | Attending: Radiation Oncology | Admitting: Radiation Oncology

## 2017-07-23 DIAGNOSIS — Z510 Encounter for antineoplastic radiation therapy: Secondary | ICD-10-CM | POA: Diagnosis not present

## 2017-07-24 ENCOUNTER — Ambulatory Visit
Admission: RE | Admit: 2017-07-24 | Discharge: 2017-07-24 | Disposition: A | Discharge status: Home / Self Care | Payer: BLUE CROSS/BLUE SHIELD | Source: Ambulatory Visit | Attending: Radiation Oncology | Admitting: Radiation Oncology

## 2017-07-24 DIAGNOSIS — Z510 Encounter for antineoplastic radiation therapy: Secondary | ICD-10-CM | POA: Diagnosis not present

## 2017-07-25 ENCOUNTER — Ambulatory Visit
Admission: RE | Admit: 2017-07-25 | Discharge: 2017-07-25 | Disposition: A | Discharge status: Home / Self Care | Payer: BLUE CROSS/BLUE SHIELD | Source: Ambulatory Visit | Attending: Radiation Oncology | Admitting: Radiation Oncology

## 2017-07-25 DIAGNOSIS — Z510 Encounter for antineoplastic radiation therapy: Secondary | ICD-10-CM | POA: Diagnosis not present

## 2017-07-26 ENCOUNTER — Ambulatory Visit
Admission: RE | Admit: 2017-07-26 | Discharge: 2017-07-26 | Disposition: A | Discharge status: Home / Self Care | Payer: BLUE CROSS/BLUE SHIELD | Source: Ambulatory Visit | Attending: Radiation Oncology | Admitting: Radiation Oncology

## 2017-07-26 DIAGNOSIS — C61 Malignant neoplasm of prostate: Secondary | ICD-10-CM | POA: Diagnosis present

## 2017-07-26 DIAGNOSIS — Z510 Encounter for antineoplastic radiation therapy: Secondary | ICD-10-CM | POA: Diagnosis present

## 2017-07-27 ENCOUNTER — Ambulatory Visit
Admission: RE | Admit: 2017-07-27 | Discharge: 2017-07-27 | Disposition: A | Discharge status: Home / Self Care | Payer: BLUE CROSS/BLUE SHIELD | Source: Ambulatory Visit | Attending: Radiation Oncology | Admitting: Radiation Oncology

## 2017-07-27 DIAGNOSIS — Z510 Encounter for antineoplastic radiation therapy: Secondary | ICD-10-CM | POA: Diagnosis not present

## 2017-07-30 ENCOUNTER — Ambulatory Visit
Admission: RE | Admit: 2017-07-30 | Discharge: 2017-07-30 | Disposition: A | Discharge status: Home / Self Care | Payer: BLUE CROSS/BLUE SHIELD | Source: Ambulatory Visit | Attending: Radiation Oncology | Admitting: Radiation Oncology

## 2017-07-30 DIAGNOSIS — Z510 Encounter for antineoplastic radiation therapy: Secondary | ICD-10-CM | POA: Diagnosis not present

## 2017-07-31 ENCOUNTER — Ambulatory Visit
Admission: RE | Admit: 2017-07-31 | Discharge: 2017-07-31 | Disposition: A | Discharge status: Home / Self Care | Payer: BLUE CROSS/BLUE SHIELD | Source: Ambulatory Visit | Attending: Radiation Oncology | Admitting: Radiation Oncology

## 2017-07-31 DIAGNOSIS — Z510 Encounter for antineoplastic radiation therapy: Secondary | ICD-10-CM | POA: Diagnosis not present

## 2017-08-01 ENCOUNTER — Ambulatory Visit
Admission: RE | Admit: 2017-08-01 | Discharge: 2017-08-01 | Disposition: A | Discharge status: Home / Self Care | Payer: BLUE CROSS/BLUE SHIELD | Source: Ambulatory Visit | Attending: Radiation Oncology | Admitting: Radiation Oncology

## 2017-08-01 DIAGNOSIS — Z510 Encounter for antineoplastic radiation therapy: Secondary | ICD-10-CM | POA: Diagnosis not present

## 2017-08-02 ENCOUNTER — Encounter: Payer: Self-pay | Admitting: Medical Oncology

## 2017-08-02 ENCOUNTER — Ambulatory Visit
Admission: RE | Admit: 2017-08-02 | Discharge: 2017-08-02 | Disposition: A | Discharge status: Home / Self Care | Payer: BLUE CROSS/BLUE SHIELD | Source: Ambulatory Visit | Attending: Radiation Oncology | Admitting: Radiation Oncology

## 2017-08-02 DIAGNOSIS — Z510 Encounter for antineoplastic radiation therapy: Secondary | ICD-10-CM | POA: Diagnosis not present

## 2017-08-03 ENCOUNTER — Ambulatory Visit
Admission: RE | Admit: 2017-08-03 | Discharge: 2017-08-03 | Disposition: A | Discharge status: Home / Self Care | Payer: BLUE CROSS/BLUE SHIELD | Source: Ambulatory Visit | Attending: Radiation Oncology | Admitting: Radiation Oncology

## 2017-08-03 DIAGNOSIS — Z510 Encounter for antineoplastic radiation therapy: Secondary | ICD-10-CM | POA: Diagnosis not present

## 2017-08-06 ENCOUNTER — Ambulatory Visit
Admission: RE | Admit: 2017-08-06 | Discharge: 2017-08-06 | Disposition: A | Discharge status: Home / Self Care | Payer: BLUE CROSS/BLUE SHIELD | Source: Ambulatory Visit | Attending: Radiation Oncology | Admitting: Radiation Oncology

## 2017-08-06 DIAGNOSIS — Z510 Encounter for antineoplastic radiation therapy: Secondary | ICD-10-CM | POA: Diagnosis not present

## 2017-08-07 ENCOUNTER — Ambulatory Visit
Admission: RE | Admit: 2017-08-07 | Discharge: 2017-08-07 | Disposition: A | Discharge status: Home / Self Care | Payer: BLUE CROSS/BLUE SHIELD | Source: Ambulatory Visit | Attending: Radiation Oncology | Admitting: Radiation Oncology

## 2017-08-07 DIAGNOSIS — Z510 Encounter for antineoplastic radiation therapy: Secondary | ICD-10-CM | POA: Diagnosis not present

## 2017-08-08 ENCOUNTER — Ambulatory Visit
Admission: RE | Admit: 2017-08-08 | Discharge: 2017-08-08 | Disposition: A | Discharge status: Home / Self Care | Payer: BLUE CROSS/BLUE SHIELD | Source: Ambulatory Visit | Attending: Radiation Oncology | Admitting: Radiation Oncology

## 2017-08-08 ENCOUNTER — Other Ambulatory Visit: Payer: Self-pay | Admitting: Radiation Oncology

## 2017-08-08 DIAGNOSIS — C61 Malignant neoplasm of prostate: Secondary | ICD-10-CM

## 2017-08-08 DIAGNOSIS — Z510 Encounter for antineoplastic radiation therapy: Secondary | ICD-10-CM | POA: Diagnosis not present

## 2017-08-08 MED ORDER — ZOLPIDEM TARTRATE 10 MG PO TABS: 10.0000 mg | ORAL_TABLET | Freq: Every evening | ORAL | 5 refills | Status: DC | PRN

## 2017-08-09 ENCOUNTER — Ambulatory Visit
Admission: RE | Admit: 2017-08-09 | Discharge: 2017-08-09 | Disposition: A | Discharge status: Home / Self Care | Payer: BLUE CROSS/BLUE SHIELD | Source: Ambulatory Visit | Attending: Radiation Oncology | Admitting: Radiation Oncology

## 2017-08-09 DIAGNOSIS — Z510 Encounter for antineoplastic radiation therapy: Secondary | ICD-10-CM | POA: Diagnosis not present

## 2017-08-10 ENCOUNTER — Ambulatory Visit
Admission: RE | Admit: 2017-08-10 | Discharge: 2017-08-10 | Disposition: A | Discharge status: Home / Self Care | Payer: BLUE CROSS/BLUE SHIELD | Source: Ambulatory Visit | Attending: Radiation Oncology | Admitting: Radiation Oncology

## 2017-08-10 DIAGNOSIS — Z510 Encounter for antineoplastic radiation therapy: Secondary | ICD-10-CM | POA: Diagnosis not present

## 2017-08-13 ENCOUNTER — Ambulatory Visit
Admission: RE | Admit: 2017-08-13 | Discharge: 2017-08-13 | Disposition: A | Discharge status: Home / Self Care | Payer: BLUE CROSS/BLUE SHIELD | Source: Ambulatory Visit | Attending: Radiation Oncology | Admitting: Radiation Oncology

## 2017-08-13 ENCOUNTER — Encounter: Payer: Self-pay | Admitting: Medical Oncology

## 2017-08-13 DIAGNOSIS — Z510 Encounter for antineoplastic radiation therapy: Secondary | ICD-10-CM | POA: Diagnosis not present

## 2017-08-14 ENCOUNTER — Ambulatory Visit
Admission: RE | Admit: 2017-08-14 | Discharge: 2017-08-14 | Disposition: A | Discharge status: Home / Self Care | Payer: BLUE CROSS/BLUE SHIELD | Source: Ambulatory Visit | Attending: Radiation Oncology | Admitting: Radiation Oncology

## 2017-08-14 DIAGNOSIS — Z510 Encounter for antineoplastic radiation therapy: Secondary | ICD-10-CM | POA: Diagnosis not present

## 2017-08-15 ENCOUNTER — Ambulatory Visit
Admission: RE | Admit: 2017-08-15 | Discharge: 2017-08-15 | Disposition: A | Discharge status: Home / Self Care | Payer: BLUE CROSS/BLUE SHIELD | Source: Ambulatory Visit | Attending: Radiation Oncology | Admitting: Radiation Oncology

## 2017-08-15 DIAGNOSIS — Z510 Encounter for antineoplastic radiation therapy: Secondary | ICD-10-CM | POA: Diagnosis not present

## 2017-08-16 ENCOUNTER — Ambulatory Visit
Admission: RE | Admit: 2017-08-16 | Discharge: 2017-08-16 | Disposition: A | Discharge status: Home / Self Care | Payer: BLUE CROSS/BLUE SHIELD | Source: Ambulatory Visit | Attending: Radiation Oncology | Admitting: Radiation Oncology

## 2017-08-16 DIAGNOSIS — Z510 Encounter for antineoplastic radiation therapy: Secondary | ICD-10-CM | POA: Diagnosis not present

## 2017-08-17 ENCOUNTER — Ambulatory Visit
Admission: RE | Admit: 2017-08-17 | Discharge: 2017-08-17 | Disposition: A | Discharge status: Home / Self Care | Payer: BLUE CROSS/BLUE SHIELD | Source: Ambulatory Visit | Attending: Radiation Oncology | Admitting: Radiation Oncology

## 2017-08-17 DIAGNOSIS — Z510 Encounter for antineoplastic radiation therapy: Secondary | ICD-10-CM | POA: Diagnosis not present

## 2017-08-21 ENCOUNTER — Ambulatory Visit
Admission: RE | Admit: 2017-08-21 | Discharge: 2017-08-21 | Disposition: A | Discharge status: Home / Self Care | Payer: BLUE CROSS/BLUE SHIELD | Source: Ambulatory Visit | Attending: Radiation Oncology | Admitting: Radiation Oncology

## 2017-08-21 DIAGNOSIS — Z510 Encounter for antineoplastic radiation therapy: Secondary | ICD-10-CM | POA: Diagnosis not present

## 2017-08-22 ENCOUNTER — Telehealth: Payer: Self-pay

## 2017-08-22 ENCOUNTER — Ambulatory Visit
Admission: RE | Admit: 2017-08-22 | Discharge: 2017-08-22 | Disposition: A | Discharge status: Home / Self Care | Payer: BLUE CROSS/BLUE SHIELD | Source: Ambulatory Visit | Attending: Radiation Oncology | Admitting: Radiation Oncology

## 2017-08-22 DIAGNOSIS — Z510 Encounter for antineoplastic radiation therapy: Secondary | ICD-10-CM | POA: Diagnosis not present

## 2017-08-22 NOTE — Telephone Encounter (Signed)
Pt called to let us know he will be late. Called linac 1 and let Emilee know.

## 2017-08-23 ENCOUNTER — Ambulatory Visit
Admission: RE | Admit: 2017-08-23 | Discharge: 2017-08-23 | Disposition: A | Discharge status: Home / Self Care | Payer: BLUE CROSS/BLUE SHIELD | Source: Ambulatory Visit | Attending: Radiation Oncology | Admitting: Radiation Oncology

## 2017-08-23 DIAGNOSIS — Z510 Encounter for antineoplastic radiation therapy: Secondary | ICD-10-CM | POA: Diagnosis not present

## 2017-08-24 ENCOUNTER — Ambulatory Visit
Admission: RE | Admit: 2017-08-24 | Discharge: 2017-08-24 | Disposition: A | Discharge status: Home / Self Care | Payer: BLUE CROSS/BLUE SHIELD | Source: Ambulatory Visit | Attending: Radiation Oncology | Admitting: Radiation Oncology

## 2017-08-24 DIAGNOSIS — Z510 Encounter for antineoplastic radiation therapy: Secondary | ICD-10-CM | POA: Diagnosis not present

## 2017-08-27 ENCOUNTER — Ambulatory Visit
Admission: RE | Admit: 2017-08-27 | Discharge: 2017-08-27 | Disposition: A | Discharge status: Home / Self Care | Payer: BLUE CROSS/BLUE SHIELD | Source: Ambulatory Visit | Attending: Radiation Oncology | Admitting: Radiation Oncology

## 2017-08-27 DIAGNOSIS — Z510 Encounter for antineoplastic radiation therapy: Secondary | ICD-10-CM | POA: Diagnosis not present

## 2017-08-28 ENCOUNTER — Ambulatory Visit
Admission: RE | Admit: 2017-08-28 | Discharge: 2017-08-28 | Disposition: A | Discharge status: Home / Self Care | Payer: BLUE CROSS/BLUE SHIELD | Source: Ambulatory Visit | Attending: Radiation Oncology | Admitting: Radiation Oncology

## 2017-08-28 DIAGNOSIS — Z510 Encounter for antineoplastic radiation therapy: Secondary | ICD-10-CM | POA: Diagnosis not present

## 2017-08-29 ENCOUNTER — Ambulatory Visit
Admission: RE | Admit: 2017-08-29 | Discharge: 2017-08-29 | Disposition: A | Discharge status: Home / Self Care | Payer: BLUE CROSS/BLUE SHIELD | Source: Ambulatory Visit | Attending: Radiation Oncology | Admitting: Radiation Oncology

## 2017-08-29 DIAGNOSIS — Z510 Encounter for antineoplastic radiation therapy: Secondary | ICD-10-CM | POA: Diagnosis not present

## 2017-08-30 ENCOUNTER — Ambulatory Visit
Admission: RE | Admit: 2017-08-30 | Discharge: 2017-08-30 | Disposition: A | Discharge status: Home / Self Care | Payer: BLUE CROSS/BLUE SHIELD | Source: Ambulatory Visit | Attending: Radiation Oncology | Admitting: Radiation Oncology

## 2017-08-30 DIAGNOSIS — Z510 Encounter for antineoplastic radiation therapy: Secondary | ICD-10-CM | POA: Diagnosis not present

## 2017-08-31 ENCOUNTER — Ambulatory Visit: Payer: BLUE CROSS/BLUE SHIELD

## 2017-09-03 ENCOUNTER — Ambulatory Visit
Admission: RE | Admit: 2017-09-03 | Discharge: 2017-09-03 | Disposition: A | Discharge status: Home / Self Care | Payer: BLUE CROSS/BLUE SHIELD | Source: Ambulatory Visit | Attending: Radiation Oncology | Admitting: Radiation Oncology

## 2017-09-03 DIAGNOSIS — Z510 Encounter for antineoplastic radiation therapy: Secondary | ICD-10-CM | POA: Diagnosis not present

## 2017-09-04 ENCOUNTER — Ambulatory Visit
Admission: RE | Admit: 2017-09-04 | Discharge: 2017-09-04 | Disposition: A | Discharge status: Home / Self Care | Payer: BLUE CROSS/BLUE SHIELD | Source: Ambulatory Visit | Attending: Radiation Oncology | Admitting: Radiation Oncology

## 2017-09-04 DIAGNOSIS — Z510 Encounter for antineoplastic radiation therapy: Secondary | ICD-10-CM | POA: Diagnosis not present

## 2017-09-05 ENCOUNTER — Ambulatory Visit
Admission: RE | Admit: 2017-09-05 | Discharge: 2017-09-05 | Disposition: A | Discharge status: Home / Self Care | Payer: BLUE CROSS/BLUE SHIELD | Source: Ambulatory Visit | Attending: Radiation Oncology | Admitting: Radiation Oncology

## 2017-09-05 DIAGNOSIS — Z510 Encounter for antineoplastic radiation therapy: Secondary | ICD-10-CM | POA: Diagnosis not present

## 2017-09-06 ENCOUNTER — Ambulatory Visit
Admission: RE | Admit: 2017-09-06 | Discharge: 2017-09-06 | Disposition: A | Discharge status: Home / Self Care | Payer: BLUE CROSS/BLUE SHIELD | Source: Ambulatory Visit | Attending: Radiation Oncology | Admitting: Radiation Oncology

## 2017-09-06 DIAGNOSIS — Z510 Encounter for antineoplastic radiation therapy: Secondary | ICD-10-CM | POA: Diagnosis not present

## 2017-09-07 ENCOUNTER — Ambulatory Visit
Admission: RE | Admit: 2017-09-07 | Discharge: 2017-09-07 | Disposition: A | Discharge status: Home / Self Care | Payer: BLUE CROSS/BLUE SHIELD | Source: Ambulatory Visit | Attending: Radiation Oncology | Admitting: Radiation Oncology

## 2017-09-07 DIAGNOSIS — Z510 Encounter for antineoplastic radiation therapy: Secondary | ICD-10-CM | POA: Diagnosis not present

## 2017-09-10 ENCOUNTER — Ambulatory Visit
Admission: RE | Admit: 2017-09-10 | Discharge: 2017-09-10 | Disposition: A | Discharge status: Home / Self Care | Payer: BLUE CROSS/BLUE SHIELD | Source: Ambulatory Visit | Attending: Radiation Oncology | Admitting: Radiation Oncology

## 2017-09-10 DIAGNOSIS — Z510 Encounter for antineoplastic radiation therapy: Secondary | ICD-10-CM | POA: Diagnosis not present

## 2017-09-11 ENCOUNTER — Ambulatory Visit
Admission: RE | Admit: 2017-09-11 | Discharge: 2017-09-11 | Disposition: A | Discharge status: Home / Self Care | Payer: BLUE CROSS/BLUE SHIELD | Source: Ambulatory Visit | Attending: Radiation Oncology | Admitting: Radiation Oncology

## 2017-09-11 ENCOUNTER — Encounter: Payer: Self-pay | Admitting: Medical Oncology

## 2017-09-11 ENCOUNTER — Ambulatory Visit: Payer: BLUE CROSS/BLUE SHIELD

## 2017-09-11 DIAGNOSIS — Z510 Encounter for antineoplastic radiation therapy: Secondary | ICD-10-CM | POA: Diagnosis not present

## 2017-09-11 NOTE — Progress Notes (Signed)
Erik Turner will complete radiation treatments tomorrow. I will not be able to be there as he rings bell so I congratulated him today. He had questions about follow up appointments and when he will get his next PSA. We discussed he will follow up with [place] in 4 weeks and then he will continue care with Dr. Junious Silk. He states he has an appointment 11/19/17 at [facility]. I asked him to call me with any questions or concerns. He voiced understanding.

## 2017-09-12 ENCOUNTER — Encounter: Payer: Self-pay | Admitting: Radiation Oncology

## 2017-09-12 ENCOUNTER — Ambulatory Visit
Admission: RE | Admit: 2017-09-12 | Discharge: 2017-09-12 | Disposition: A | Discharge status: Home / Self Care | Payer: BLUE CROSS/BLUE SHIELD | Source: Ambulatory Visit | Attending: Radiation Oncology | Admitting: Radiation Oncology

## 2017-09-12 DIAGNOSIS — Z510 Encounter for antineoplastic radiation therapy: Secondary | ICD-10-CM | POA: Diagnosis not present

## 2017-09-12 NOTE — Progress Notes (Signed)
°  Radiation Oncology         [phone] ________________________________  Name: Erik Turner MRN: [id number]  Date: 09/12/2017  DOB: 16-Sep-1956  End of Treatment Note  Diagnosis:   61 y.o. 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 The prostate, seminal vesicles, and pelvic lymph nodes were initially treated to 45 Gy in 25 fractions of 1.8 Gy  2. The prostate only was boosted to 75 Gy with 15 additional fractions of 2.0 Gy   Beams/energy:  1. The prostate, seminal vesicles, and pelvic lymph nodes were initially treated using VMAT intensity modulated radiotherapy delivering 6 megavolt photons. Image guidance was performed with CB-CT studies prior to each fraction. He was immobilized with a body fix lower extremity mold.  2. The prostate only was boosted using VMAT intensity modulated radiotherapy delivering 6 megavolt photons. Image guidance was performed with CB-CT studies prior to each fraction. He was immobilized with a body fix lower extremity mold.  Narrative: The patient tolerated radiation treatment relatively well.   The patient experienced modest fatigue and minor urinary irritation. His urinary symptoms included nocturia x2-4, intermittent stream, and urgency. He denied any pain, dysuria, hematuria, or leakage. He denied any bowel issues.   Plan: The patient has completed radiation treatment. He will return to radiation oncology clinic for routine followup in one month. I advised him to call or return sooner if he has any questions or concerns related to his recovery or treatment. ________________________________  Sheral Apley. Tammi Klippel, M.D.  This document serves as a record of services personally performed by [name], MD. It was created on his  behalf by Rae Lips, a trained medical scribe. The creation of this record is based on the scribe's personal observations and the provider's statements to them. This document has been checked and approved by the attending provider.

## 2017-09-20 ENCOUNTER — Telehealth: Payer: Self-pay | Admitting: Radiation Oncology

## 2017-09-20 NOTE — Telephone Encounter (Signed)
Faxed RETURN TO WORK note to [employer] as requested by patient. Fax confirmation of delivery obtained.

## 2017-09-24 ENCOUNTER — Telehealth: Payer: Self-pay | Admitting: Medical Oncology

## 2017-09-24 NOTE — Telephone Encounter (Signed)
Mr. Eller left a message asking me to send Dr. Johny Shears office number and fax to his short term disability company. I faxed the above information to [employer]. [phone] O# [phone] ext [phone]

## 2017-09-27 ENCOUNTER — Telehealth: Payer: Self-pay | Admitting: Medical Oncology

## 2017-09-27 ENCOUNTER — Encounter: Payer: Self-pay | Admitting: *Deleted

## 2017-09-27 NOTE — Progress Notes (Signed)
On 09-27-17 got attending physician's statement from [place], given to the nurse

## 2017-09-27 NOTE — Telephone Encounter (Signed)
Mr. Lia called stating that [facility] faxed over a form for Dr. Tammi Klippel to complete in order for him to get his last disability check. He states that the form was returned back to the company but it was blank. He states the company has resubmitted the form and Mr. Riebel asked if we can complete so he can get paid. I did forward this message to Sam.

## 2017-09-28 ENCOUNTER — Encounter: Payer: Self-pay | Admitting: Radiation Oncology

## 2017-09-28 ENCOUNTER — Encounter: Payer: Self-pay | Admitting: Medical Oncology

## 2017-09-28 ENCOUNTER — Telehealth: Payer: Self-pay | Admitting: Medical Oncology

## 2017-09-28 NOTE — Progress Notes (Signed)
Completed [employer] disability paperwork to the best of my ability. Placed orange folder with complete paperwork in PA, Bruning's inbox to review and sign.

## 2017-09-28 NOTE — Telephone Encounter (Signed)
Erik Turner left a message stating he really needs to know the status of the [employer] disability form. I spoke with Erik Turner-Dr. Johny Turner nurse and she did receive the form this morning. She will complete and fax back by the end of today. I called Erik Turner and left him this information.

## 2017-10-08 ENCOUNTER — Encounter: Payer: Self-pay | Admitting: Radiation Oncology

## 2017-10-08 ENCOUNTER — Telehealth: Payer: Self-pay | Admitting: Medical Oncology

## 2017-10-08 ENCOUNTER — Encounter: Payer: Self-pay | Admitting: Medical Oncology

## 2017-10-08 ENCOUNTER — Telehealth: Payer: Self-pay | Admitting: Radiation Oncology

## 2017-10-08 NOTE — Telephone Encounter (Signed)
Received voicemail message from patient upset about [employer] paperwork and work note. Promptly return patient's call. Explained a work note was done 09/18/17 releasing him back to work without restrictions for 09/24/17. Went onto explain this letter was faxed to [employer] and confirmation of fax delivery was obtained. In addition, explained [employer] disability paperwork was completed on 09/28/17 and personally delivered to [employer], PA-C to sign. Patient verbalized understanding and expressed appreciation for the return call.

## 2017-10-08 NOTE — Progress Notes (Signed)
I faxed [employer] paperwork on 10/03/17.

## 2017-10-09 NOTE — Progress Notes (Signed)
I spoke with Erik Turner- radiation oncology to inform her of patient's frustrations regarding getting his follow up appointment rescheduled. She was unaware of any calls or requests. She called Erik Turner and rescheduled his follow up to 11/16/17. Erik Turner relayed a message she had received from [place] at patient's place of employment. I spoke with [facility] and she wanted to follow up on the [employer] disability forms. She asked if I would fax her a copy when I located the completed forms.

## 2017-10-09 NOTE — Telephone Encounter (Signed)
Mr. Toya  venting his frustrations,  stating that his [place] paper work was never faxed and he has not been able to get his last disability check. He also states  he has called to reschedule his follow up appointment with [facility] but he only get voicemail. I returned his call to inform him I can see where his return to work note was faxed on 09/18/17 to [facility]. I know that Sam completed the paperwork for [employer] last week and was waiting for a signature from [place]. I will follow up and on the paper work and call him back.

## 2017-10-09 NOTE — Telephone Encounter (Signed)
I spoke with Mr. Kenna to inform him I was able to track down the [facility] forms. They were completed 10/12, faxed to [facility] 10/03/17 and I had the cover letter with status of ok. I re-faxed the forms and a copy of the cover letter from 10/03/17 to [employer]. I also faxed a copy to [place]. I told him I understand his frustrations regarding his finances and hope that he receives his check quickly. He voiced appreciation for my time and helping to resolve this matter.

## 2017-10-18 ENCOUNTER — Ambulatory Visit: Payer: Self-pay | Admitting: Urology

## 2017-10-19 ENCOUNTER — Ambulatory Visit: Payer: Self-pay | Admitting: Urology

## 2017-11-15 ENCOUNTER — Telehealth: Payer: Self-pay | Admitting: Medical Oncology

## 2017-11-16 ENCOUNTER — Ambulatory Visit
Admission: RE | Admit: 2017-11-16 | Discharge: 2017-11-16 | Disposition: A | Discharge status: Home / Self Care | Payer: BLUE CROSS/BLUE SHIELD | Source: Ambulatory Visit | Attending: Radiation Oncology | Admitting: Radiation Oncology

## 2017-11-16 ENCOUNTER — Other Ambulatory Visit: Payer: Self-pay

## 2017-11-16 ENCOUNTER — Encounter: Payer: Self-pay | Admitting: Urology

## 2017-11-16 VITALS — BP 144/89 | HR 76 | Temp 98.1°F | Resp 20 | Ht 66.0 in | Wt 261.4 lb

## 2017-11-16 DIAGNOSIS — Z923 Personal history of irradiation: Secondary | ICD-10-CM | POA: Diagnosis not present

## 2017-11-16 DIAGNOSIS — Z7982 Long term (current) use of aspirin: Secondary | ICD-10-CM | POA: Diagnosis not present

## 2017-11-16 DIAGNOSIS — R530 Neoplastic (malignant) related fatigue: Secondary | ICD-10-CM | POA: Insufficient documentation

## 2017-11-16 DIAGNOSIS — Z880 Allergy status to penicillin: Secondary | ICD-10-CM | POA: Diagnosis not present

## 2017-11-16 DIAGNOSIS — R351 Nocturia: Secondary | ICD-10-CM | POA: Diagnosis not present

## 2017-11-16 DIAGNOSIS — C61 Malignant neoplasm of prostate: Secondary | ICD-10-CM | POA: Diagnosis present

## 2017-11-16 DIAGNOSIS — Z79899 Other long term (current) drug therapy: Secondary | ICD-10-CM | POA: Diagnosis not present

## 2017-11-16 NOTE — Progress Notes (Signed)
Radiation Oncology         [phone] ________________________________  Name: Erik Turner MRN: [id number]  Date: 11/16/2017  DOB: 04/29/1956  Post Treatment Note  CC: Everardo Beals, NP  Raynelle Bring, MD  Diagnosis:   61 y.o. 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 The prostate, seminal vesicles, and pelvic lymph nodes were initially treated to 45 Gy in 25 fractions of 1.8 Gy  2. The prostate only was boosted to 75 Gy with 15 additional fractions of 2.0 Gy   Narrative:  The patient returns today for routine follow-up.  He tolerated radiation treatment relatively well.   The patient experienced modest fatigue and minor urinary irritation. His urinary symptoms included nocturia x2-4, intermittent stream, and urgency. He denied any pain, dysuria, hematuria, or leakage and denied any bowel issues.                           On review of systems, the patient states that he is doing very well overall. He reports complete resolution of LUTS, having now returned back to his baseline,  with nocturia 2 per night and occasional feelings of incomplete emptying. He specifically denies dysuria, gross hematuria, urgency or incontinence. He denies abdominal pain, nausea, vomiting or diarrhea. He reports a healthy appetite and is maintaining his weight. He continues with persistent fatigue but feels that this is gradually improving. He also reports intermittent hot flashes associated with his ADT.   ALLERGIES:  is allergic to penicillins.  Meds: Current Outpatient Medications  Medication Sig Dispense Refill  . ASPIRIN 81 PO Take 1 tablet by mouth daily.    . cholecalciferol (VITAMIN D) 1000 units tablet Take 1,000 Units by mouth daily.    Marland Kitchen lisinopril-hydrochlorothiazide (PRINZIDE,ZESTORETIC) 20-25 MG tablet Take 1 tablet by  mouth daily.    . Multiple Vitamin (MULTIVITAMIN) tablet Take 1 tablet by mouth daily.    . Multiple Vitamins-Minerals (HAIR SKIN AND NAILS FORMULA) TABS Take 1 tablet by mouth daily.    Marland Kitchen NIFEdipine (PROCARDIA-XL/ADALAT CC) 30 MG 24 hr tablet Take 30 mg by mouth daily.    Marland Kitchen zolpidem (AMBIEN) 10 MG tablet Take 1 tablet (10 mg total) by mouth at bedtime as needed for sleep. 20 tablet 5   No current facility-administered medications for this encounter.     Physical Findings:  height is 5\' 6"  (1.676 m) and weight is 261 lb 6.4 oz (118.6 kg). His oral temperature is 98.1 F (36.7 C). His blood pressure is 144/89 (abnormal) and his pulse is 76. His respiration is 20.  Pain Assessment Pain Score: 0-No pain/10 In general this is a well appearingCaucasian male in no acute distress. He is alert and oriented x4 and appropriate throughout the examination. Cardiopulmonary assessment is negative for acute distress and he exhibits normal effort.   Lab Findings: Lab Results  Component Value Date   WBC 9.5 03/02/2017   HGB 15.5 03/02/2017   HCT 44.3 03/02/2017   MCV 84.1 03/02/2017   PLT 175 03/02/2017     Radiographic Findings: No results found.  Impression/Plan: 1. 61 y.o. 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   He has recovered well from the effects  of radiotherapy and continues to tolerate ADT well. He will continue to follow up with urology for ongoing PSA determinations and has an appointment scheduled with Dr. Junious Silk on Dec. 3rd. He anticipates completing a 2 year course of ADT. He understands what to expect with regards to PSA monitoring going forward. I will look forward to following his response to treatment via correspondence with urology, and would be happy to continue to participate in his care if clinically indicated. I talked to the patient about what to expect in the future, including  his risk for erectile dysfunction and rectal bleeding. I encouraged him to call or return to the office if he has any questions regarding his previous radiation or possible radiation side effects. He was comfortable with this plan and will follow up as needed.    Nicholos Johns, PA-C

## 2018-02-18 ENCOUNTER — Encounter: Payer: Self-pay | Admitting: Medical Oncology

## 2018-02-18 NOTE — Progress Notes (Signed)
Patient called stating that he ordered a supplement and when he received it, the contents contain testosterone. He is asking if it is ok to take. I explained that he is receiving ADT to suppress his testosterone to help control his prostate cancer so he should not take. He voiced that is what is thought so he will not take.

## 2018-03-01 ENCOUNTER — Other Ambulatory Visit: Payer: Self-pay | Admitting: Radiation Oncology

## 2018-03-04 ENCOUNTER — Telehealth: Payer: Self-pay | Admitting: Radiation Oncology

## 2018-03-04 NOTE — Telephone Encounter (Signed)
Ambien script found on printer. Phoned Tameka at [facility], [place]. She confirmed the script could be faxed. Faxed Ambien script, attention Tameka, to [phone]. Fax confirmation of delivery obtained.

## 2018-04-19 ENCOUNTER — Telehealth: Payer: Self-pay | Admitting: Medical Oncology

## 2018-04-19 NOTE — Telephone Encounter (Signed)
Patient called and left a message stating that he had poison ivy and took a steroid dose pak. He is concerned this may have affected his prostate cancer. He asked for a return call. I attempted to reach him but his phone does not have voicemail.

## 2018-05-17 ENCOUNTER — Telehealth: Payer: Self-pay | Admitting: Medical Oncology

## 2018-05-17 NOTE — Telephone Encounter (Signed)
Erik Turner left a message stating his phone died and he lost all of his appointments. He is scheduled to see Dr. Junious Silk in June and asked if I could get these appointments for him. I attempted to reach him but his voicemail is not set up on his new phone. I mailed a letter to him with the following dates. Lab 6/10 at 7:45 am and Dr. Junious Silk 6/17 at 10:00 am.

## 2019-03-27 NOTE — Telephone Encounter (Signed)
Patient called asking if he can take testosterone supplements. I asked him to call his urologist to discuss. He voiced understanding.

## 2019-03-27 NOTE — Telephone Encounter (Signed)
op

## 2020-10-13 ENCOUNTER — Encounter (HOSPITAL_BASED_OUTPATIENT_CLINIC_OR_DEPARTMENT_OTHER): Payer: BLUE CROSS/BLUE SHIELD | Admitting: Physician Assistant

## 2021-09-16 DIAGNOSIS — E119 Type 2 diabetes mellitus without complications: Secondary | ICD-10-CM | POA: Diagnosis not present

## 2021-09-16 DIAGNOSIS — I10 Essential (primary) hypertension: Secondary | ICD-10-CM | POA: Diagnosis not present

## 2021-09-29 ENCOUNTER — Ambulatory Visit: Payer: Self-pay | Admitting: Neurology

## 2021-09-29 ENCOUNTER — Encounter: Payer: Self-pay | Admitting: Neurology

## 2022-02-13 ENCOUNTER — Other Ambulatory Visit (HOSPITAL_COMMUNITY): Payer: Self-pay

## 2022-02-15 ENCOUNTER — Ambulatory Visit: Payer: Self-pay | Admitting: Family Medicine

## 2022-02-15 NOTE — Progress Notes (Deleted)
? ?  Erik Turner is a 66 y.o. male who presents to [facility] today for the following: ? ?*** ? ? ?PMH reviewed. *** ?ROS as above. ?Medications reviewed. ? ?Exam:  ?There were no vitals taken for this visit. ?Gen: Well NAD ?MSK: ? ?*** Shoulder: ?Inspection reveals no obvious deformity, atrophy, or asymmetry b/l. No bruising. No swelling ?Palpation is normal with no TTP over [facility] joint or bicipital groove b/l. ?Full ROM in flexion, abduction, internal/external rotation b/l ?NV intact distally b/l ?Normal scapular function observed b/l ?Special Tests:  ?- Impingement: Neg Hawkins, neers, empty can sign. ?- Supraspinatous: Negative empty can ?- Infraspinatous/Teres Minor: 5/5 strength with ER ?- Subscapularis: 5/5 strength with IR ?- Biceps tendon: Negative Speeds, Yerrgason's  ?- Labrum: Negative Obriens, negative clunk, good stability ?- AC Joint: Negative cross arm ?- Negative apprehension test ?- No painful arc and no drop arm sign ? ? ?No results found. ? ? ?Assessment and Plan: ?1) No problem-specific Assessment & Plan notes found for this encounter. ? ? ?Arizona Constable, D.O.  ?PGY-4 [facility]  ?02/15/2022 8:21 AM ?

## 2022-03-06 ENCOUNTER — Other Ambulatory Visit: Payer: Self-pay

## 2022-03-06 ENCOUNTER — Emergency Department (HOSPITAL_COMMUNITY): Payer: Medicare Other

## 2022-03-06 ENCOUNTER — Emergency Department (HOSPITAL_COMMUNITY)
Admission: EM | Admit: 2022-03-06 | Discharge: 2022-03-08 | Disposition: A | Discharge status: Home / Self Care | Payer: Medicare Other | Attending: Emergency Medicine | Admitting: Emergency Medicine

## 2022-03-06 ENCOUNTER — Encounter (HOSPITAL_COMMUNITY): Payer: Self-pay

## 2022-03-06 DIAGNOSIS — S40812A Abrasion of left upper arm, initial encounter: Secondary | ICD-10-CM | POA: Insufficient documentation

## 2022-03-06 DIAGNOSIS — E860 Dehydration: Secondary | ICD-10-CM | POA: Diagnosis not present

## 2022-03-06 DIAGNOSIS — I10 Essential (primary) hypertension: Secondary | ICD-10-CM | POA: Insufficient documentation

## 2022-03-06 DIAGNOSIS — M25562 Pain in left knee: Secondary | ICD-10-CM | POA: Diagnosis not present

## 2022-03-06 DIAGNOSIS — S80812A Abrasion, left lower leg, initial encounter: Secondary | ICD-10-CM | POA: Insufficient documentation

## 2022-03-06 DIAGNOSIS — M25561 Pain in right knee: Secondary | ICD-10-CM | POA: Diagnosis not present

## 2022-03-06 DIAGNOSIS — Z7982 Long term (current) use of aspirin: Secondary | ICD-10-CM | POA: Insufficient documentation

## 2022-03-06 DIAGNOSIS — S40811A Abrasion of right upper arm, initial encounter: Secondary | ICD-10-CM | POA: Insufficient documentation

## 2022-03-06 DIAGNOSIS — N179 Acute kidney failure, unspecified: Secondary | ICD-10-CM | POA: Diagnosis not present

## 2022-03-06 DIAGNOSIS — W19XXXA Unspecified fall, initial encounter: Secondary | ICD-10-CM | POA: Diagnosis not present

## 2022-03-06 DIAGNOSIS — R296 Repeated falls: Secondary | ICD-10-CM

## 2022-03-06 DIAGNOSIS — E119 Type 2 diabetes mellitus without complications: Secondary | ICD-10-CM | POA: Diagnosis not present

## 2022-03-06 DIAGNOSIS — Z79899 Other long term (current) drug therapy: Secondary | ICD-10-CM | POA: Insufficient documentation

## 2022-03-06 DIAGNOSIS — Z5900 Homelessness unspecified: Secondary | ICD-10-CM | POA: Diagnosis not present

## 2022-03-06 DIAGNOSIS — S80811A Abrasion, right lower leg, initial encounter: Secondary | ICD-10-CM | POA: Insufficient documentation

## 2022-03-06 DIAGNOSIS — Z7984 Long term (current) use of oral hypoglycemic drugs: Secondary | ICD-10-CM | POA: Diagnosis not present

## 2022-03-06 DIAGNOSIS — S4991XA Unspecified injury of right shoulder and upper arm, initial encounter: Secondary | ICD-10-CM | POA: Diagnosis present

## 2022-03-06 LAB — CBC WITH DIFFERENTIAL/PLATELET
Abs Immature Granulocytes: 0.05 10*3/uL (ref 0.00–0.07)
Basophils Absolute: 0 10*3/uL (ref 0.0–0.1)
Basophils Relative: 0 %
Eosinophils Absolute: 0 10*3/uL (ref 0.0–0.5)
Eosinophils Relative: 0 %
HCT: 41.5 % (ref 39.0–52.0)
Hemoglobin: 14.2 g/dL (ref 13.0–17.0)
Immature Granulocytes: 0 %
Lymphocytes Relative: 8 %
Lymphs Abs: 1 10*3/uL (ref 0.7–4.0)
MCH: 29.8 pg (ref 26.0–34.0)
MCHC: 34.2 g/dL (ref 30.0–36.0)
MCV: 87.2 fL (ref 80.0–100.0)
Monocytes Absolute: 0.7 10*3/uL (ref 0.1–1.0)
Monocytes Relative: 6 %
Neutro Abs: 10.1 10*3/uL — ABNORMAL HIGH (ref 1.7–7.7)
Neutrophils Relative %: 86 %
Platelets: 157 10*3/uL (ref 150–400)
RBC: 4.76 MIL/uL (ref 4.22–5.81)
RDW: 13.3 % (ref 11.5–15.5)
WBC: 11.9 10*3/uL — ABNORMAL HIGH (ref 4.0–10.5)
nRBC: 0 % (ref 0.0–0.2)

## 2022-03-06 LAB — URINALYSIS, ROUTINE W REFLEX MICROSCOPIC
Bilirubin Urine: NEGATIVE
Glucose, UA: 50 mg/dL — AB
Ketones, ur: 5 mg/dL — AB
Leukocytes,Ua: NEGATIVE
Nitrite: NEGATIVE
Protein, ur: 100 mg/dL — AB
Specific Gravity, Urine: 1.024 (ref 1.005–1.030)
pH: 5 (ref 5.0–8.0)

## 2022-03-06 LAB — COMPREHENSIVE METABOLIC PANEL
ALT: 38 U/L (ref 0–44)
AST: 47 U/L — ABNORMAL HIGH (ref 15–41)
Albumin: 4 g/dL (ref 3.5–5.0)
Alkaline Phosphatase: 68 U/L (ref 38–126)
Anion gap: 13 (ref 5–15)
BUN: 26 mg/dL — ABNORMAL HIGH (ref 8–23)
CO2: 25 mmol/L (ref 22–32)
Calcium: 9 mg/dL (ref 8.9–10.3)
Chloride: 100 mmol/L (ref 98–111)
Creatinine, Ser: 1.46 mg/dL — ABNORMAL HIGH (ref 0.61–1.24)
GFR, Estimated: 52 mL/min — ABNORMAL LOW (ref >60)
Glucose, Bld: 192 mg/dL — ABNORMAL HIGH (ref 70–99)
Potassium: 3.4 mmol/L — ABNORMAL LOW (ref 3.5–5.1)
Sodium: 138 mmol/L (ref 135–145)
Total Bilirubin: 1.4 mg/dL — ABNORMAL HIGH (ref 0.3–1.2)
Total Protein: 7.1 g/dL (ref 6.5–8.1)

## 2022-03-06 LAB — LACTIC ACID, PLASMA: Lactic Acid, Venous: 3.1 mmol/L (ref 0.5–1.9)

## 2022-03-06 LAB — CBG MONITORING, ED: Glucose-Capillary: 165 mg/dL — ABNORMAL HIGH (ref 70–99)

## 2022-03-06 MED ORDER — SODIUM CHLORIDE 0.9 % IV BOLUS
1000.0000 mL | Freq: Once | INTRAVENOUS | Status: AC
  Administered 2022-03-06: 1000 mL via INTRAVENOUS

## 2022-03-06 MED ORDER — LOSARTAN POTASSIUM 25 MG PO TABS
100.0000 mg | ORAL_TABLET | Freq: Every day | ORAL | Status: DC
  Administered 2022-03-07: 100 mg via ORAL
  Filled 2022-03-06: qty 4

## 2022-03-06 MED ORDER — TRAZODONE HCL 50 MG PO TABS: 50.0000 mg | ORAL_TABLET | Freq: Every evening | ORAL | Status: DC | PRN

## 2022-03-06 MED ORDER — DUTASTERIDE 0.5 MG PO CAPS
0.5000 mg | ORAL_CAPSULE | Freq: Every day | ORAL | Status: DC
  Administered 2022-03-07: 0.5 mg via ORAL
  Filled 2022-03-06 (×5): qty 1

## 2022-03-06 MED ORDER — AMLODIPINE BESYLATE 5 MG PO TABS
10.0000 mg | ORAL_TABLET | Freq: Every day | ORAL | Status: DC
  Administered 2022-03-07: 10 mg via ORAL
  Filled 2022-03-06: qty 2

## 2022-03-06 MED ORDER — METFORMIN HCL 500 MG PO TABS
1000.0000 mg | ORAL_TABLET | Freq: Every day | ORAL | Status: DC
  Administered 2022-03-07 – 2022-03-08 (×2): 1000 mg via ORAL
  Filled 2022-03-06 (×2): qty 2

## 2022-03-06 MED ORDER — CARVEDILOL 3.125 MG PO TABS
3.1250 mg | ORAL_TABLET | Freq: Two times a day (BID) | ORAL | Status: DC
  Administered 2022-03-07 – 2022-03-08 (×3): 3.125 mg via ORAL
  Filled 2022-03-06 (×3): qty 1

## 2022-03-06 NOTE — ED Triage Notes (Signed)
Patient to ED with complaints of chronic bilateral knee pain. States that he cannot stand because of knee pain. Patient is homeless and law enforcement called out to report him lying on the side [address] three times before being brought to ED. Patient refused transport x2.  ?

## 2022-03-06 NOTE — ED Notes (Addendum)
Bed moved to hall 4 due to [facility] hold. Pt remains asleep. Belongings *including pt's colostomy supplies* under head of bed. IV remains in place.  ?

## 2022-03-06 NOTE — ED Notes (Addendum)
Pt homeless. He has been staying in his vehicle however it has been repossessed/towed. Vehicle has all of his belongings. Attempt go to overnight stay in a local motel with the assist of [employer] however the rooms are booked. AC unable to provide any accommodations as well.  EDP notified of possible TOC involvement/ stay overnight.   ?Pt states he does not have anyone he can call that can pick him up and give him somewhere to stay for the night.  ?

## 2022-03-06 NOTE — ED Notes (Signed)
Colostomy bag/appliance changed at this time. Noted with severe excoriation around site.  ?

## 2022-03-06 NOTE — ED Provider Notes (Signed)
?[facility] ?Provider Note ? ? ?CSN: [id number] ?Arrival date & time: 03/06/22  1507 ? ?  ? ?History ? ?Chief Complaint  ?Patient presents with  ? Knee Pain  ? ? ?Erik Turner is a 66 y.o. male. ? ?Pt is a 66 yo male with a hx of htn and dm2.  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  He was admitted to [facility] on 3/4 for hypertensive emergency and was d/c on 3/6.  He is homeless and lives out of his car.  He said his car was impounded 2 days ago and he's been living outside.  Pt said he has had frequent falls in the last few days.  His supplies for his colostomy and his clothes were in the car.  While in the hospital, he had a ct and mri which showed a concern for normal pressure hydrocephalus. NS was consulted, but as he was improved mentally, they did not feel that anything emergent was needed.  Pt said he's been taking the bp meds.  He was not prescribed anything for DM.  He was brought here today by EMS [place]  He said he was unable to ambulate due to knee pain.  ? ? ?  ? ?Home Medications ?Prior to Admission medications   ?Medication Sig Start Date End Date Taking? Authorizing Provider  ?amLODipine (NORVASC) 10 MG tablet amlodipine 10 mg tablet 02/20/22  Yes [provider]  ?carvedilol (COREG) 3.125 MG tablet carvedilol 3.125 mg tablet 02/20/22  Yes [provider]  ?losartan (COZAAR) 100 MG tablet losartan 100 mg tablet 02/20/22  Yes [provider]  ?metFORMIN (GLUCOPHAGE) 500 MG tablet metformin 500 mg tablet 02/20/22  Yes [provider]  ?ASPIRIN 81 PO Take 1 tablet by mouth daily.    [provider]  ?cholecalciferol (VITAMIN D) 1000 units tablet Take 1,000 Units by mouth daily.    [provider]  ?Cholecalciferol 25 MCG (1000 UT) capsule Take by mouth.    [provider]  ?dexamethasone (DECADRON) 10 MG/ML injection dexamethasone sodium phosphate 10 mg/mL injection  solution ? Take 10 mg by injection route.    [provider]  ?dutasteride (AVODART) 0.5 MG capsule dutasteride 0.5 mg capsule    [provider]  ?lisinopril-hydrochlorothiazide (PRINZIDE,ZESTORETIC) 20-25 MG tablet Take 1 tablet by mouth daily.    [provider]  ?losartan (COZAAR) 50 MG tablet losartan 50 mg tablet    [provider]  ?Multiple Vitamin (MULTIVITAMIN) tablet Take 1 tablet by mouth daily.    [provider]  ?Multiple Vitamins-Minerals (HAIR SKIN AND NAILS FORMULA) TABS Take 1 tablet by mouth daily.    [provider]  ?NIFEdipine (PROCARDIA-XL/ADALAT CC) 30 MG 24 hr tablet Take 30 mg by mouth daily.    [provider]  ?traZODone (DESYREL) 50 MG tablet trazodone 50 mg tablet    [provider]  ?zolpidem (AMBIEN) 10 MG tablet TAKE 1 TABLET BY MOUTH AT BEDTIME AS NEEDED FOR SLEEP 03/02/18   [name], MD  ?   ? ?Allergies    ?Penicillins   ? ?Review of Systems   ?Review of Systems  ?Musculoskeletal:  Positive for back pain.  ?     Knee pain bilateral  ?All other systems reviewed and are negative. ? ?Physical Exam ?Updated Vital Signs ?BP (!) 148/69   Pulse 96   Temp 97.7 ?F (  36.5 ?C) (Oral)   Resp 20   Ht '5\' 6"'$  (1.676 m)   Wt 77.1 kg   SpO2 98%   BMI 27.44 kg/m?  ?Physical Exam ?Vitals and nursing note reviewed.  ?Constitutional:   ?   Appearance: Normal appearance.  ?HENT:  ?   Head: Normocephalic and atraumatic.  ?   Right Ear: External ear normal.  ?   Left Ear: External ear normal.  ?   Nose: Nose normal.  ?   Mouth/Throat:  ?   Mouth: Mucous membranes are moist.  ?   Pharynx: Oropharynx is clear.  ?Eyes:  ?   Extraocular Movements: Extraocular movements intact.  ?   Conjunctiva/sclera: Conjunctivae normal.  ?   Pupils: Pupils are equal, round, and reactive to light.  ?Cardiovascular:  ?   Rate and Rhythm: Normal rate and regular rhythm.  ?   Pulses: Normal pulses.  ?   Heart sounds: Normal heart sounds.   ?Pulmonary:  ?   Effort: Pulmonary effort is normal.  ?   Breath sounds: Normal breath sounds.  ?Abdominal:  ?   General: Abdomen is flat. Bowel sounds are normal.  ?   Palpations: Abdomen is soft.  ?   Comments: Colostomy noted  ?Musculoskeletal:     ?   General: Normal range of motion.  ?   Cervical back: Normal range of motion and neck supple.  ?Skin: ?   Comments: Multiple abrasions to arms and legs. ? ?Excoriation around ostomy site.  ?Neurological:  ?   General: No focal deficit present.  ?   Mental Status: He is alert.  ?Psychiatric:     ?   Mood and Affect: Mood normal.     ?   Behavior: Behavior normal.  ? ? ?ED Results / Procedures / Treatments   ?Labs ?(all labs ordered are listed, but only abnormal results are displayed) ?Labs Reviewed  ?CBC WITH DIFFERENTIAL/PLATELET - Abnormal; Notable for the following components:  ?    Result Value  ? WBC 11.9 (*)   ? Neutro Abs 10.1 (*)   ? All other components within normal limits  ?COMPREHENSIVE METABOLIC PANEL - Abnormal; Notable for the following components:  ? Potassium 3.4 (*)   ? Glucose, Bld 192 (*)   ? BUN 26 (*)   ? Creatinine, Ser 1.46 (*)   ? AST 47 (*)   ? Total Bilirubin 1.4 (*)   ? GFR, Estimated 52 (*)   ? All other components within normal limits  ?URINALYSIS, ROUTINE W REFLEX MICROSCOPIC - Abnormal; Notable for the following components:  ? Color, Urine AMBER (*)   ? APPearance HAZY (*)   ? Glucose, UA 50 (*)   ? Hgb urine dipstick SMALL (*)   ? Ketones, ur 5 (*)   ? Protein, ur 100 (*)   ? Bacteria, UA MANY (*)   ? All other components within normal limits  ?LACTIC ACID, PLASMA - Abnormal; Notable for the following components:  ? Lactic Acid, Venous 3.1 (*)   ? All other components within normal limits  ?CBG MONITORING, ED - Abnormal; Notable for the following components:  ? Glucose-Capillary 165 (*)   ? All other components within normal limits  ?CBG MONITORING, ED  ? ? ?EKG ?EKG Interpretation ? ?Date/Time:  Monday March 06 2022 15:40:07  EDT ?Ventricular Rate:  98 ?PR Interval:  181 ?QRS Duration: 91 ?QT Interval:  364 ?QTC Calculation: 465 ?R Axis:   79 ?Text Interpretation: Sinus rhythm  Low voltage, precordial leads RSR' in V1 or V2, right VCD or RVH Abnormal inferior Q waves No old tracing to compare Confirmed by Isla Pence [phone]) on 03/06/2022 4:14:29 PM ? ?Radiology ?DG Chest 2 View ? ?Result Date: 03/06/2022 ?CLINICAL DATA:  Status post fall with knee pain. EXAM: CHEST - 2 VIEW COMPARISON:  None. FINDINGS: The heart size and mediastinal contours are within normal limits. Both lungs are clear. The visualized skeletal structures are unremarkable. IMPRESSION: No active cardiopulmonary disease. Electronically Signed   By: Abelardo Diesel M.D.   On: 03/06/2022 16:41  ? ?DG Lumbar Spine Complete ? ?Result Date: 03/06/2022 ?CLINICAL DATA:  Status post fall with low back pain. EXAM: LUMBAR SPINE - COMPLETE 5 VIEW COMPARISON:  None. FINDINGS: There is no evidence of lumbar spine fracture. Alignment is normal. Intervertebral disc spaces are maintained. Mild anterior osteophytosis is identified in the lumbar spine. IMPRESSION: No acute fracture or dislocation. Mild degenerative joint changes. Electronically Signed   By: Abelardo Diesel M.D.   On: 03/06/2022 16:40  ? ?CT Head Wo Contrast ? ?Result Date: 03/06/2022 ?CLINICAL DATA:  [place] [address] with head trauma earlier today. Mental status changes. EXAM: CT HEAD WITHOUT CONTRAST TECHNIQUE: Contiguous axial images were obtained from the base of the skull through the vertex without intravenous contrast. RADIATION DOSE REDUCTION: This exam was performed according to the departmental dose-optimization program which includes automated exposure control, adjustment of the mA and/or kV according to patient size and/or use of iterative reconstruction technique. COMPARISON:  None. FINDINGS: Brain: Brain atrophy, advanced for age. Chronic small-vessel ischemic changes of the cerebral hemispheric white matter, advanced for age.  No evidence of acute infarction, mass lesion, hemorrhage, hydrocephalus or extra-axial collection. Vascular: No abnormal vascular finding. Skull: Normal Sinuses/Orbits: Clear/normal Other: None IMPRESSION: No acute or

## 2022-03-06 NOTE — ED Notes (Signed)
Pt ambulated with stand by assistance. Stated that "its about as good as its going to get" ?

## 2022-03-07 DIAGNOSIS — N179 Acute kidney failure, unspecified: Secondary | ICD-10-CM | POA: Diagnosis not present

## 2022-03-07 LAB — CBG MONITORING, ED: Glucose-Capillary: 144 mg/dL — ABNORMAL HIGH (ref 70–99)

## 2022-03-07 NOTE — ED Notes (Signed)
Pt verbalized his car was towed by police because it was broke down. Pt stated he thought it was towed by the sheriff. Pt verbalized he does his own colostomy care.  ?

## 2022-03-07 NOTE — ED Notes (Signed)
Social worker interacting with pt.  ?
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
## 2022-03-07 NOTE — ED Notes (Signed)
Breakfast tray given to pt 
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
## 2022-03-07 NOTE — ED Notes (Signed)
Pt has dinner tray. 

## 2022-03-07 NOTE — Consult Note (Addendum)
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 He has since been supplied with pouching supplies following calls from both bedside nursing and house administrative coordinator to this writer last evening. ? ?ICD-10 CM Codes for Irritant Dermatitis ? ?L24B1 - Related to digestive stoma or fistula ? ? ?Treatment options for stomal/peristomal skin: I have provided Nursing with instructions for the care of peristomal skin with irritant contact dermatitis via the Orders. ?Output: Thin brown effluent ?Ostomy pouching: 1pc convex cut-to-fit pouch with skin barrier ring and clamp.  Stoma powder for treatment of peristomal irritant contact dermatitis. ? ?Supplies: ?Ostomy pouch is Kellie Simmering # [phone] with skin barrier ring, [place] # [phone] and clamp, [place] # [phone]. Stoma powder is Kellie Simmering # 6. ? ?[facility] nursing team will not follow, but will remain available to this patient, the nursing and medical teams.  Please re-consult if needed. ?Thanks, ?Maudie Flakes, MSN, RN, [place], [place], CWON-AP, [place]  ?Pager# [phone]  ? ? ? ?
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
## 2022-03-08 NOTE — ED Notes (Signed)
Attempted to call [employer] to setup transportation at this time. Unable to leave voicemail at this time do to mailbox being full. ?

## 2022-03-08 NOTE — ED Notes (Signed)
Patient provided paper scrub pants and back in bed at this time.  ?

## 2022-03-08 NOTE — ED Notes (Signed)
Spoke with [employer] will come pick the patient and transport to [employer]. ?

## 2022-03-08 NOTE — Discharge Instructions (Signed)
Call your primary care doctor or specialist as discussed in the next 2-3 days.   Return immediately back to the ER if:  Your symptoms worsen within the next 12-24 hours. You develop new symptoms such as new fevers, persistent vomiting, new pain, shortness of breath, or new weakness or numbness, or if you have any other concerns.  

## 2022-03-08 NOTE — ED Notes (Signed)
Patient ambulated to restroom with walker. Patient provided with washcloths, towels, and cleanser per request.  ?

## 2022-03-08 NOTE — ED Notes (Signed)
[employer] called again. No answer. ?

## 2022-03-09 ENCOUNTER — Encounter (HOSPITAL_COMMUNITY): Payer: Self-pay | Admitting: *Deleted

## 2022-03-09 ENCOUNTER — Emergency Department (HOSPITAL_COMMUNITY)
Admission: EM | Admit: 2022-03-09 | Discharge: 2022-03-10 | Disposition: A | Discharge status: Home / Self Care | Payer: Medicare Other | Source: Home / Self Care | Attending: Emergency Medicine | Admitting: Emergency Medicine

## 2022-03-09 DIAGNOSIS — Z7982 Long term (current) use of aspirin: Secondary | ICD-10-CM | POA: Insufficient documentation

## 2022-03-09 DIAGNOSIS — W19XXXA Unspecified fall, initial encounter: Secondary | ICD-10-CM

## 2022-03-09 DIAGNOSIS — W1839XA Other fall on same level, initial encounter: Secondary | ICD-10-CM | POA: Insufficient documentation

## 2022-03-09 DIAGNOSIS — Y92828 Other wilderness area as the place of occurrence of the external cause: Secondary | ICD-10-CM | POA: Insufficient documentation

## 2022-03-09 DIAGNOSIS — Z79899 Other long term (current) drug therapy: Secondary | ICD-10-CM | POA: Insufficient documentation

## 2022-03-09 DIAGNOSIS — Y9301 Activity, walking, marching and hiking: Secondary | ICD-10-CM | POA: Insufficient documentation

## 2022-03-09 DIAGNOSIS — Z8546 Personal history of malignant neoplasm of prostate: Secondary | ICD-10-CM | POA: Insufficient documentation

## 2022-03-09 DIAGNOSIS — Z043 Encounter for examination and observation following other accident: Secondary | ICD-10-CM | POA: Insufficient documentation

## 2022-03-09 DIAGNOSIS — I10 Essential (primary) hypertension: Secondary | ICD-10-CM | POA: Insufficient documentation

## 2022-03-09 DIAGNOSIS — Z5900 Homelessness unspecified: Secondary | ICD-10-CM | POA: Insufficient documentation

## 2022-03-09 NOTE — ED Notes (Signed)
Pt given wash rags, towel, soap, and new paper scrubs to clean up and change.  ?Pt emptied colostomy bag while in the bathroom ?

## 2022-03-09 NOTE — ED Notes (Signed)
Pt ambulated to the bathroom.  

## 2022-03-09 NOTE — ED Triage Notes (Signed)
Brought to the ED by EMS after being found lying on the road, Patient states he fell ?
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
## 2022-03-10 ENCOUNTER — Emergency Department (HOSPITAL_COMMUNITY): Payer: Medicare Other

## 2022-03-10 ENCOUNTER — Inpatient Hospital Stay (HOSPITAL_COMMUNITY)
Admission: EM | Admit: 2022-03-10 | Discharge: 2022-03-13 | DRG: 682 | Disposition: A | Discharge status: Home / Self Care | Payer: Medicare Other | Attending: Student | Admitting: Student

## 2022-03-10 ENCOUNTER — Other Ambulatory Visit: Payer: Self-pay

## 2022-03-10 DIAGNOSIS — R296 Repeated falls: Secondary | ICD-10-CM | POA: Diagnosis present

## 2022-03-10 DIAGNOSIS — Z7982 Long term (current) use of aspirin: Secondary | ICD-10-CM | POA: Diagnosis not present

## 2022-03-10 DIAGNOSIS — K760 Fatty (change of) liver, not elsewhere classified: Secondary | ICD-10-CM | POA: Diagnosis present

## 2022-03-10 DIAGNOSIS — E1122 Type 2 diabetes mellitus with diabetic chronic kidney disease: Secondary | ICD-10-CM | POA: Diagnosis present

## 2022-03-10 DIAGNOSIS — E860 Dehydration: Secondary | ICD-10-CM | POA: Diagnosis present

## 2022-03-10 DIAGNOSIS — N400 Enlarged prostate without lower urinary tract symptoms: Secondary | ICD-10-CM | POA: Diagnosis present

## 2022-03-10 DIAGNOSIS — Z5902 Unsheltered homelessness: Secondary | ICD-10-CM

## 2022-03-10 DIAGNOSIS — N1831 Chronic kidney disease, stage 3a: Secondary | ICD-10-CM | POA: Diagnosis present

## 2022-03-10 DIAGNOSIS — M546 Pain in thoracic spine: Secondary | ICD-10-CM | POA: Diagnosis present

## 2022-03-10 DIAGNOSIS — I129 Hypertensive chronic kidney disease with stage 1 through stage 4 chronic kidney disease, or unspecified chronic kidney disease: Secondary | ICD-10-CM | POA: Diagnosis present

## 2022-03-10 DIAGNOSIS — Z5900 Homelessness unspecified: Secondary | ICD-10-CM

## 2022-03-10 DIAGNOSIS — R4182 Altered mental status, unspecified: Secondary | ICD-10-CM | POA: Diagnosis not present

## 2022-03-10 DIAGNOSIS — Z8546 Personal history of malignant neoplasm of prostate: Secondary | ICD-10-CM

## 2022-03-10 DIAGNOSIS — Z9049 Acquired absence of other specified parts of digestive tract: Secondary | ICD-10-CM

## 2022-03-10 DIAGNOSIS — N179 Acute kidney failure, unspecified: Principal | ICD-10-CM | POA: Diagnosis present

## 2022-03-10 DIAGNOSIS — Z880 Allergy status to penicillin: Secondary | ICD-10-CM | POA: Diagnosis not present

## 2022-03-10 DIAGNOSIS — E876 Hypokalemia: Secondary | ICD-10-CM | POA: Diagnosis present

## 2022-03-10 DIAGNOSIS — Z79899 Other long term (current) drug therapy: Secondary | ICD-10-CM | POA: Diagnosis not present

## 2022-03-10 DIAGNOSIS — G9341 Metabolic encephalopathy: Secondary | ICD-10-CM | POA: Diagnosis present

## 2022-03-10 DIAGNOSIS — Z87891 Personal history of nicotine dependence: Secondary | ICD-10-CM

## 2022-03-10 DIAGNOSIS — Z794 Long term (current) use of insulin: Secondary | ICD-10-CM

## 2022-03-10 DIAGNOSIS — G9389 Other specified disorders of brain: Secondary | ICD-10-CM | POA: Diagnosis not present

## 2022-03-10 DIAGNOSIS — Z9079 Acquired absence of other genital organ(s): Secondary | ICD-10-CM

## 2022-03-10 DIAGNOSIS — N183 Chronic kidney disease, stage 3 unspecified: Secondary | ICD-10-CM

## 2022-03-10 DIAGNOSIS — Z933 Colostomy status: Secondary | ICD-10-CM

## 2022-03-10 DIAGNOSIS — Z79891 Long term (current) use of opiate analgesic: Secondary | ICD-10-CM

## 2022-03-10 DIAGNOSIS — N189 Chronic kidney disease, unspecified: Secondary | ICD-10-CM

## 2022-03-10 DIAGNOSIS — I10 Essential (primary) hypertension: Secondary | ICD-10-CM

## 2022-03-10 DIAGNOSIS — E119 Type 2 diabetes mellitus without complications: Secondary | ICD-10-CM | POA: Diagnosis not present

## 2022-03-10 DIAGNOSIS — Z7984 Long term (current) use of oral hypoglycemic drugs: Secondary | ICD-10-CM

## 2022-03-10 DIAGNOSIS — B372 Candidiasis of skin and nail: Secondary | ICD-10-CM | POA: Diagnosis present

## 2022-03-10 LAB — COMPREHENSIVE METABOLIC PANEL
ALT: 43 U/L (ref 0–44)
AST: 58 U/L — ABNORMAL HIGH (ref 15–41)
Albumin: 4 g/dL (ref 3.5–5.0)
Alkaline Phosphatase: 65 U/L (ref 38–126)
Anion gap: 11 (ref 5–15)
BUN: 24 mg/dL — ABNORMAL HIGH (ref 8–23)
CO2: 27 mmol/L (ref 22–32)
Calcium: 9.2 mg/dL (ref 8.9–10.3)
Chloride: 98 mmol/L (ref 98–111)
Creatinine, Ser: 2.11 mg/dL — ABNORMAL HIGH (ref 0.61–1.24)
GFR, Estimated: 34 mL/min — ABNORMAL LOW (ref >60)
Glucose, Bld: 228 mg/dL — ABNORMAL HIGH (ref 70–99)
Potassium: 4.2 mmol/L (ref 3.5–5.1)
Sodium: 136 mmol/L (ref 135–145)
Total Bilirubin: 1.6 mg/dL — ABNORMAL HIGH (ref 0.3–1.2)
Total Protein: 7 g/dL (ref 6.5–8.1)

## 2022-03-10 LAB — URINALYSIS, ROUTINE W REFLEX MICROSCOPIC
Bilirubin Urine: NEGATIVE
Glucose, UA: 50 mg/dL — AB
Hgb urine dipstick: NEGATIVE
Ketones, ur: NEGATIVE mg/dL
Leukocytes,Ua: NEGATIVE
Nitrite: NEGATIVE
Protein, ur: NEGATIVE mg/dL
Specific Gravity, Urine: 1.034 — ABNORMAL HIGH (ref 1.005–1.030)
pH: 5 (ref 5.0–8.0)

## 2022-03-10 LAB — CBC WITH DIFFERENTIAL/PLATELET
Abs Immature Granulocytes: 0.05 10*3/uL (ref 0.00–0.07)
Basophils Absolute: 0 10*3/uL (ref 0.0–0.1)
Basophils Relative: 0 %
Eosinophils Absolute: 0.1 10*3/uL (ref 0.0–0.5)
Eosinophils Relative: 1 %
HCT: 43.4 % (ref 39.0–52.0)
Hemoglobin: 14.4 g/dL (ref 13.0–17.0)
Immature Granulocytes: 1 %
Lymphocytes Relative: 12 %
Lymphs Abs: 1.1 10*3/uL (ref 0.7–4.0)
MCH: 29 pg (ref 26.0–34.0)
MCHC: 33.2 g/dL (ref 30.0–36.0)
MCV: 87.3 fL (ref 80.0–100.0)
Monocytes Absolute: 0.7 10*3/uL (ref 0.1–1.0)
Monocytes Relative: 7 %
Neutro Abs: 7.7 10*3/uL (ref 1.7–7.7)
Neutrophils Relative %: 79 %
Platelets: 175 10*3/uL (ref 150–400)
RBC: 4.97 MIL/uL (ref 4.22–5.81)
RDW: 13.5 % (ref 11.5–15.5)
WBC: 9.6 10*3/uL (ref 4.0–10.5)
nRBC: 0 % (ref 0.0–0.2)

## 2022-03-10 LAB — HIV ANTIBODY (ROUTINE TESTING W REFLEX): HIV Screen 4th Generation wRfx: NONREACTIVE

## 2022-03-10 LAB — PROTIME-INR
INR: 1 (ref 0.8–1.2)
Prothrombin Time: 13.6 seconds (ref 11.4–15.2)

## 2022-03-10 LAB — ETHANOL: Alcohol, Ethyl (B): <10 mg/dL (ref <10)

## 2022-03-10 LAB — LACTIC ACID, PLASMA
Lactic Acid, Venous: 2.3 mmol/L (ref 0.5–1.9)
Lactic Acid, Venous: 1.8 mmol/L (ref 0.5–1.9)

## 2022-03-10 LAB — AMMONIA: Ammonia: 11 umol/L (ref 9–35)

## 2022-03-10 MED ORDER — DUTASTERIDE 0.5 MG PO CAPS
0.5000 mg | ORAL_CAPSULE | Freq: Every day | ORAL | Status: DC
  Administered 2022-03-11 – 2022-03-13 (×4): 0.5 mg via ORAL
  Filled 2022-03-10 (×6): qty 1

## 2022-03-10 MED ORDER — VITAMIN D 25 MCG (1000 UNIT) PO TABS
1000.0000 [IU] | ORAL_TABLET | Freq: Every day | ORAL | Status: DC
  Administered 2022-03-11 – 2022-03-13 (×4): 1000 [IU] via ORAL
  Filled 2022-03-10 (×4): qty 1

## 2022-03-10 MED ORDER — TRAZODONE HCL 50 MG PO TABS
25.0000 mg | ORAL_TABLET | Freq: Every evening | ORAL | Status: DC | PRN
  Filled 2022-03-10: qty 1

## 2022-03-10 MED ORDER — ACETAMINOPHEN 325 MG PO TABS
650.0000 mg | ORAL_TABLET | Freq: Four times a day (QID) | ORAL | Status: DC | PRN
  Administered 2022-03-11 – 2022-03-13 (×2): 650 mg via ORAL
  Filled 2022-03-10 (×2): qty 2

## 2022-03-10 MED ORDER — TRAZODONE HCL 50 MG PO TABS
50.0000 mg | ORAL_TABLET | Freq: Every day | ORAL | Status: DC
  Administered 2022-03-11 – 2022-03-12 (×2): 50 mg via ORAL
  Filled 2022-03-10 (×2): qty 1

## 2022-03-10 MED ORDER — ADULT MULTIVITAMIN W/MINERALS CH
1.0000 | ORAL_TABLET | Freq: Every day | ORAL | Status: DC
  Administered 2022-03-11 – 2022-03-13 (×3): 1 via ORAL
  Filled 2022-03-10 (×3): qty 1

## 2022-03-10 MED ORDER — AMLODIPINE BESYLATE 5 MG PO TABS: 10.0000 mg | ORAL_TABLET | Freq: Every day | ORAL | Status: DC

## 2022-03-10 MED ORDER — MAGNESIUM HYDROXIDE 400 MG/5ML PO SUSP: 30.0000 mL | Freq: Every day | ORAL | Status: DC | PRN

## 2022-03-10 MED ORDER — SODIUM CHLORIDE 0.9 % IV SOLN: INTRAVENOUS | Status: DC

## 2022-03-10 MED ORDER — NIFEDIPINE ER OSMOTIC RELEASE 30 MG PO TB24
30.0000 mg | ORAL_TABLET | Freq: Every day | ORAL | Status: DC
  Administered 2022-03-11 – 2022-03-12 (×2): 30 mg via ORAL
  Filled 2022-03-10 (×2): qty 1

## 2022-03-10 MED ORDER — ENOXAPARIN SODIUM 40 MG/0.4ML IJ SOSY
40.0000 mg | PREFILLED_SYRINGE | INTRAMUSCULAR | Status: DC
  Administered 2022-03-11 – 2022-03-12 (×3): 40 mg via SUBCUTANEOUS
  Filled 2022-03-10 (×3): qty 0.4

## 2022-03-10 MED ORDER — ACETAMINOPHEN 650 MG RE SUPP: 650.0000 mg | Freq: Four times a day (QID) | RECTAL | Status: DC | PRN

## 2022-03-10 MED ORDER — CARVEDILOL 3.125 MG PO TABS
3.1250 mg | ORAL_TABLET | Freq: Two times a day (BID) | ORAL | Status: DC
  Administered 2022-03-11 – 2022-03-12 (×3): 3.125 mg via ORAL
  Filled 2022-03-10 (×3): qty 1

## 2022-03-10 MED ORDER — IOHEXOL 300 MG/ML  SOLN
100.0000 mL | Freq: Once | INTRAMUSCULAR | Status: AC | PRN
  Administered 2022-03-10: 100 mL via INTRAVENOUS

## 2022-03-10 MED ORDER — ZOLPIDEM TARTRATE 5 MG PO TABS: 5.0000 mg | ORAL_TABLET | Freq: Every evening | ORAL | Status: DC | PRN

## 2022-03-10 MED ORDER — SODIUM CHLORIDE 0.9 % IV BOLUS: 1000.0000 mL | Freq: Once | INTRAVENOUS | Status: DC

## 2022-03-10 MED ORDER — ONDANSETRON HCL 4 MG PO TABS: 4.0000 mg | ORAL_TABLET | Freq: Four times a day (QID) | ORAL | Status: DC | PRN

## 2022-03-10 MED ORDER — ONDANSETRON HCL 4 MG/2ML IJ SOLN: 4.0000 mg | Freq: Four times a day (QID) | INTRAMUSCULAR | Status: DC | PRN

## 2022-03-10 MED ORDER — SODIUM CHLORIDE 0.9 % IV BOLUS
2000.0000 mL | Freq: Once | INTRAVENOUS | Status: AC
  Administered 2022-03-10: 2000 mL via INTRAVENOUS

## 2022-03-10 MED ORDER — HAIR SKIN AND NAILS FORMULA PO TABS: 1.0000 | ORAL_TABLET | Freq: Every day | ORAL | Status: DC

## 2022-03-10 NOTE — ED Notes (Addendum)
Placed new colostomy bag on pt. Redness and irritation noted around stoma ?

## 2022-03-10 NOTE — ED Notes (Signed)
Pt ambulatory to [facility]

## 2022-03-10 NOTE — ED Notes (Signed)
CSW provided pt with homelessness resources. CSW explained that this resource list is the same as the one provided to pt on 3/21. CSW explained that he can reach out to the homeless shelters to see if there are any open beds. CSW updated RN that pt can use list and reach out to see about who may have a bed. TOC signing off.  ?

## 2022-03-10 NOTE — ED Notes (Signed)
Patient still showering right now.  Will changed ostomy when he is done.  ?

## 2022-03-10 NOTE — Assessment & Plan Note (Addendum)
BP improved after discontinuing IV IV fluid and starting amlodipine and losartan. ?-Discontinued Coreg due to bradycardia.  Discontinue nifedipine as well. ? ?

## 2022-03-10 NOTE — ED Notes (Signed)
Pt discharge with personal belongings to [facility] for ride to facility in [place]. ?

## 2022-03-10 NOTE — H&P (Addendum)
?  ?  ?[place] ? ? ?PATIENT NAME: Erik Turner   ? ?MR#:  [id number] ? ?DATE OF BIRTH:  02/05/1956 ? ?DATE OF ADMISSION:  03/10/2022 ? ?PRIMARY CARE PHYSICIAN: [name], MD  ? ?Patient is coming from: Home ? ?REQUESTING/REFERRING PHYSICIAN: [name], PA-C ? ?CHIEF COMPLAINT:  ? ?Chief Complaint  ?Patient presents with  ? Altered Mental Status  ? ? ?HISTORY OF PRESENT ILLNESS:  ?Erik Turner is a 66 y.o. Caucasian male with medical history significant for essential hypertension and type 2 diabetes mellitus, who presented to the emergency room with acute onset of altered mental status.  The patient was seen in any pain ED yesterday after having a fall without injuries.  He was managed and discharged to [facility] as he is homeless.  Today was noted to be very confused.  He admitted to recent recurrent falls about 3 times lately.  He denied any significant injuries.  No head injuries per his report.  He admitted to diminished urine output without dysuria, urinary frequency or urgency or flank pain.  No chest pain or dyspnea or cough.  No nausea or vomiting or diarrhea or abdominal pain.  No paresthesias or focal muscle weakness.  No tinnitus or vertigo. ? ?He was recently seen at [facility] for metabolic cephalopathy and hypertensive urgency and had an MRI at that was concerning than for ventriculomegaly and NPH. ? ?ED Course: When he came to the ER vital signs were within normal.  Labs revealed lactic acid of 2.3 and later 1.8.  CMP revealed a BUN of 24 and creatinine 2.11 with a blood glucose of 228.  Total bili was 1.6 and AST 58.  Ammonia level was 11.  CBC was within normal. ?EKG as reviewed by me : EKG showed normal sinus rhythm with a rate of 87 with borderline right axis deviation and low voltage QRS with poor R wave progression.  It should inferior Q waves. ?Imaging: Abdominal pelvic CT scan revealed no evidence for traumatic injury or other acute findings.  It showed  cholelithiasis without evidence of cholecystitis.  It also showed hepatic steatosis. ?2 view chest x-ray showed no acute cardiopulmonary disease. ? ?The patient was given 2 L bolus of IV normal saline.  He will be admitted to a medical bed for further evaluation and management. ?PAST MEDICAL HISTORY:  ? ?Past Medical History:  ?Diagnosis Date  ? 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 ?PAST SURGICAL HISTORY:  ? ?Past Surgical History:  ?Procedure Laterality Date  ? COLON SURGERY    ? Ileostomy 1976  ? ? ?SOCIAL HISTORY:  ? ?Social History  ? ?Tobacco Use  ? Smoking status: Former  ?  Types: Cigarettes  ?  Quit date: 43  ?  Years since quitting: 49.2  ? Smokeless tobacco: Former  ?Substance Use Topics  ? Alcohol use: Not Currently  ?  Alcohol/week: 1.0 standard drink  ?  Types: 1 Shots of liquor per week  ?  Comment: 3-4 months  ? ? ?FAMILY HISTORY:  ?No family history on file.  No pertinent familial diseases. ? ?DRUG ALLERGIES:  ? ?Allergies  ?Allergen Reactions  ? Penicillins   ?  Patient states that all family members are allergic to penicillin & that he has been told by another physician to avoid penicillin.   ? ? ?REVIEW OF SYSTEMS:  ? ?ROS ?As per history of present illness. All pertinent  systems were reviewed above. Constitutional, HEENT, cardiovascular, respiratory, GI, GU, musculoskeletal, neuro, psychiatric, endocrine, integumentary and hematologic systems were reviewed and are otherwise negative/unremarkable except for positive findings mentioned above in the HPI. ? ? ?MEDICATIONS AT HOME:  ? ?Prior to Admission medications   ?Medication Sig Start Date End Date Taking? Authorizing Provider  ?amLODipine (NORVASC) 10 MG tablet Take 10 mg by mouth daily. 02/20/22   [provider]  ?ASPIRIN 81 PO Take 1 tablet by mouth daily.    [provider]  ?carvedilol (COREG) 3.125 MG tablet Take 3.125 mg by mouth 2 (two) times daily with a meal.  02/20/22   [provider]  ?cholecalciferol (VITAMIN D) 1000 units tablet Take 1,000 Units by mouth daily.    [provider]  ?Cholecalciferol 25 MCG (1000 UT) capsule Take by mouth.    [provider]  ?dexamethasone (DECADRON) 10 MG/ML injection dexamethasone sodium phosphate 10 mg/mL injection solution ? Take 10 mg by injection route.    [provider]  ?dutasteride (AVODART) 0.5 MG capsule dutasteride 0.5 mg capsule    [provider]  ?lisinopril-hydrochlorothiazide (PRINZIDE,ZESTORETIC) 20-25 MG tablet Take 1 tablet by mouth daily.    [provider]  ?losartan (COZAAR) 100 MG tablet Take 100 mg by mouth daily. 02/20/22   [provider]  ?losartan (COZAAR) 50 MG tablet Take 50 mg by mouth daily.    [provider]  ?metFORMIN (GLUCOPHAGE) 500 MG tablet Take 500 mg by mouth 2 (two) times daily with a meal. 02/20/22   [provider]  ?Multiple Vitamin (MULTIVITAMIN) tablet Take 1 tablet by mouth daily.    [provider]  ?Multiple Vitamins-Minerals (HAIR SKIN AND NAILS FORMULA) TABS Take 1 tablet by mouth daily.    [provider]  ?NIFEdipine (PROCARDIA-XL/ADALAT CC) 30 MG 24 hr tablet Take 30 mg by mouth daily.    [provider]  ?traZODone (DESYREL) 50 MG tablet trazodone 50 mg tablet    [provider]  ?zolpidem (AMBIEN) 10 MG tablet TAKE 1 TABLET BY MOUTH AT BEDTIME AS NEEDED FOR SLEEP ?Patient taking differently: Take 10 mg by mouth at bedtime as needed for sleep. 03/02/18   [name], MD  ? ?  ? ?VITAL SIGNS:  ?Blood pressure 139/74, pulse 92, temperature 98.4 ?F (36.9 ?C), temperature source Oral, resp. rate (!) 21, SpO2 99 %. ? ?PHYSICAL EXAMINATION:  ?Physical Exam ? ?GENERAL:  66 y.o.-year-old Caucasian patient lying in the bed with no acute distress.  ?EYES: Pupils equal, round, reactive to light and accommodation. No scleral icterus. Extraocular muscles intact.  ?HEENT: Head  atraumatic, normocephalic. Oropharynx and nasopharynx clear.  ?NECK:  Supple, no jugular venous distention. No thyroid enlargement, no tenderness.  ?LUNGS: Normal breath sounds bilaterally, no wheezing, rales,rhonchi or crepitation. No use of accessory muscles of respiration.  ?CARDIOVASCULAR: Regular rate and rhythm, S1, S2 normal. No murmurs, rubs, or gallops.  ?ABDOMEN: Soft, nondistended, nontender. Bowel sounds present. No organomegaly or mass.  Intact left lower quadrant colostomy bag. ?EXTREMITIES: No pedal edema, cyanosis, or clubbing.  ?NEUROLOGIC: Cranial nerves II through XII are intact. Muscle strength 5/5 in all extremities. Sensation intact. Gait not checked.  ?PSYCHIATRIC: The patient is alert and oriented x 3.  Normal affect and good eye contact. ?SKIN: Erythematous left groin and intertriginous area. ?LABORATORY PANEL:  ? ?CBC ?Recent Labs  ?Lab 03/10/22 ?1626  ?WBC 9.6  ?HGB 14.4  ?HCT 43.4  ?PLT 175  ? ?------------------------------------------------------------------------------------------------------------------ ? ?Chemistries  ?  Recent Labs  ?Lab 03/10/22 ?1626  ?NA 136  ?K 4.2  ?CL 98  ?CO2 27  ?GLUCOSE 228*  ?BUN 24*  ?CREATININE 2.11*  ?CALCIUM 9.2  ?AST 58*  ?ALT 43  ?ALKPHOS 65  ?BILITOT 1.6*  ? ?------------------------------------------------------------------------------------------------------------------ ? ?Cardiac Enzymes ?No results for input(s): TROPONINI in the last 168 hours. ?------------------------------------------------------------------------------------------------------------------ ? ?RADIOLOGY:  ?DG Chest 2 View ? ?Result Date: 03/10/2022 ?CLINICAL DATA:  Sepsis EXAM: CHEST - 2 VIEW COMPARISON:  03/06/2022 FINDINGS: The heart size and mediastinal contours are within normal limits. Both lungs are clear. The visualized skeletal structures are unremarkable. IMPRESSION: No active cardiopulmonary disease. Electronically Signed   By: Randa Ngo M.D.   On: 03/10/2022 17:46   ? ?CT HEAD WO CONTRAST (5MM) ? ?Result Date: 03/10/2022 ?CLINICAL DATA:  Mental status change EXAM: CT HEAD WITHOUT CONTRAST TECHNIQUE: Contiguous axial images were obtained from the base of the skull through th

## 2022-03-10 NOTE — ED Triage Notes (Signed)
Pt via [facility] from [facility] c/o AMS/ confusion. Per EMS, pt GCS 12. Pt seen at [facility] for fall yesterday, DC via taxi. IRC called EMS r/t AMS ? ?101.5 oral ?129/61 ?HR 91 ?RR 30 ? ?No meds/IV en route ?

## 2022-03-10 NOTE — Assessment & Plan Note (Addendum)
Recent Labs  ?  03/06/22 ?1544 03/10/22 ?1626 03/11/22 ?0108 03/12/22 ?0225 03/13/22 ?5400  ?BUN 26* 24* '20 14 9  '$ ?CREATININE 1.46* 2.11* 1.41* 0.98 0.84  ?Likely from dehydration.  Resolved. ? ?

## 2022-03-10 NOTE — ED Notes (Signed)
Meal tray ordered and dietary called.  ?

## 2022-03-10 NOTE — Assessment & Plan Note (Addendum)
Not taking Avodart at home. ? ? ?

## 2022-03-10 NOTE — Assessment & Plan Note (Addendum)
Uncontrolled NIDDM-2 with hyperglycemia.  A1c 8.1%. ?Recent Labs  ?Lab 03/12/22 ?1608 03/12/22 ?2040 03/12/22 ?2112 03/13/22 ?0759 03/13/22 ?1153  ?GLUCAP 203* 168* 136* 158* 246*  ?-Discharged on metformin and glipizide ?-Also started on atorvastatin and losartan. ?

## 2022-03-10 NOTE — Assessment & Plan Note (Addendum)
Likely due to dehydration and AKI.  Resolved. ?

## 2022-03-10 NOTE — Assessment & Plan Note (Addendum)
Continue nystatin powder. 

## 2022-03-10 NOTE — ED Notes (Signed)
Patients ostomy bag has leaked all over and soiled patient and bed.  Patient in bathroom cleaning up and then going to get patient to take a shower then replace colostomy bag ?

## 2022-03-10 NOTE — Consult Note (Signed)
[place] Nurse Consult Note ? ?ED RN contacted this writer as patient returned to the ED on 03/09/22 in the afternoon following a fall without injury. Discharged from the ED on 03/08/22 in the am. Patient is unhoused. ? ?Patient to be discharged after bring provided homeless shelter resource list from [facility] (he was provided this same list on 03/06/22 according to the [facility] notes). ? ?Ostomy supplies that were sent with patient on 03/08/22 are not with patient.  New orders placed for supplies and these will be sent with patient when the taxi arrives in approximately 1 hour. ?Supplies: ?One-piece convex drainable Pouch is Kellie Simmering # [id number] Ordered 5 ?2.   Skin barrier ring: Lawson # [phone] Ordered 5 ?3.   Stoma Powder:  Lawson # [phone] ordered 1 ?4.   Ostomy belt: Marcy Siren Ordered 2 ? ? [place] nursing team will not follow, but will remain available to this patient, the nursing and medical teams.   ?Thanks, ?Maudie Flakes, MSN, RN, [place], [place], CWON-AP, [place]  ?Pager# [phone]  ?

## 2022-03-10 NOTE — ED Provider Notes (Signed)
?[place] ?Provider Note ? ? ?CSN: [id number] ?Arrival date & time: 03/10/22  1546 ? ?  ? ?History ? ?Chief Complaint  ?Patient presents with  ? Altered Mental Status  ? ? ?Erik Turner is a 66 y.o. male who presents from the [facility] with altered mental status.  Patient is unable to give the history due to significant confusion.  History is gathered by review of patient's chart.  Patient has been seen recently multiple times at an outpatient facility with concern for potential normal pressure hydrocephalus.  He is homeless.  He was seen yesterday at [facility] for a fall, was evaluated by social work and discharge.  Patient somehow ended up in [place] at the [facility] where he was found to be extremely confused and sitting in the hot sun and sent emergently to the ER for evaluation.  Patient is unable to give any other history. ? ? ?Altered Mental Status ? ?  ? ?Home Medications ?Prior to Admission medications   ?Medication Sig Start Date End Date Taking? Authorizing Provider  ?zolpidem (AMBIEN) 10 MG tablet TAKE 1 TABLET BY MOUTH AT BEDTIME AS NEEDED FOR SLEEP ?Patient not taking: Reported on 03/11/2022 03/02/18   [name], MD  ?   ? ?Allergies    ?Penicillins   ? ?Review of Systems   ?Review of Systems ? ?Physical Exam ?Updated Vital Signs ?BP (!) 148/91 (BP Location: Left Arm)   Pulse 66   Temp 98.2 ?F (36.8 ?C) (Oral)   Resp 16   SpO2 98%  ?Physical Exam ?Constitutional:   ?   General: He is not in acute distress. ?   Appearance: He is not toxic-appearing.  ?HENT:  ?   Head: Normocephalic and atraumatic.  ?   Mouth/Throat:  ?   Mouth: Mucous membranes are moist.  ?Eyes:  ?   Extraocular Movements: Extraocular movements intact.  ?   Pupils: Pupils are equal, round, and reactive to light.  ?Cardiovascular:  ?   Rate and Rhythm: Normal rate.  ?Pulmonary:  ?   Effort: Pulmonary effort is normal.  ?   Breath sounds: Normal breath sounds.   ?Abdominal:  ?   General: There is no distension.  ?   Tenderness: There is no abdominal tenderness.  ?   Comments: Abdomen is rotund.  There is a colostomy bag noted in the left lower quadrant.  There is a erythematous verrucous lesion to the right of the colostomy bag without evidence of infection.  Inferior to the colostomy in the groin there is an angry red moist oozing and erythematous rash involving the left groin and intertriginous region.  ?Musculoskeletal:     ?   General: Normal range of motion.  ?   Cervical back: Normal range of motion and neck supple.  ?Skin: ?   General: Skin is warm and dry.  ?Neurological:  ?   Mental Status: He is alert. He is disoriented.  ?   Cranial Nerves: No cranial nerve deficit.  ?   Sensory: No sensory deficit.  ?   Motor: No weakness.  ?   Comments: Patient oriented to self only.  He answers when asked where he is "I am awake and making sense."  ? ? ?ED Results / Procedures / Treatments   ?Labs ?(all labs ordered are listed, but only abnormal results are displayed) ?Labs Reviewed  ?COMPREHENSIVE METABOLIC PANEL - Abnormal; Notable for the following components:  ?  Result Value  ? Glucose, Bld 228 (*)   ? BUN 24 (*)   ? Creatinine, Ser 2.11 (*)   ? AST 58 (*)   ? Total Bilirubin 1.6 (*)   ? GFR, Estimated 34 (*)   ? All other components within normal limits  ?LACTIC ACID, PLASMA - Abnormal; Notable for the following components:  ? Lactic Acid, Venous 2.3 (*)   ? All other components within normal limits  ?URINALYSIS, ROUTINE W REFLEX MICROSCOPIC - Abnormal; Notable for the following components:  ? Specific Gravity, Urine 1.034 (*)   ? Glucose, UA 50 (*)   ? All other components within normal limits  ?BASIC METABOLIC PANEL - Abnormal; Notable for the following components:  ? Potassium 2.7 (*)   ? Glucose, Bld 204 (*)   ? Creatinine, Ser 1.41 (*)   ? Calcium 8.4 (*)   ? GFR, Estimated 55 (*)   ? All other components within normal limits  ?CBC - Abnormal; Notable for the  following components:  ? Hemoglobin 12.9 (*)   ? HCT 37.4 (*)   ? All other components within normal limits  ?HEMOGLOBIN A1C - Abnormal; Notable for the following components:  ? Hgb A1c MFr Bld 8.1 (*)   ? All other components within normal limits  ?GLUCOSE, CAPILLARY - Abnormal; Notable for the following components:  ? Glucose-Capillary 176 (*)   ? All other components within normal limits  ?CBG MONITORING, ED - Abnormal; Notable for the following components:  ? Glucose-Capillary 216 (*)   ? All other components within normal limits  ?CULTURE, BLOOD (ROUTINE X 2)  ?CULTURE, BLOOD (ROUTINE X 2)  ?LACTIC ACID, PLASMA  ?CBC WITH DIFFERENTIAL/PLATELET  ?PROTIME-INR  ?AMMONIA  ?ETHANOL  ?HIV ANTIBODY (ROUTINE TESTING W REFLEX)  ?MAGNESIUM  ?POTASSIUM  ?VITAMIN B1  ? ? ?EKG ?EKG Interpretation ? ?Date/Time:  Friday March 10 2022 15:59:12 EDT ?Ventricular Rate:  87 ?PR Interval:  166 ?QRS Duration: 91 ?QT Interval:  398 ?QTC Calculation: 479 ?R Axis:   83 ?Text Interpretation: Sinus rhythm Borderline right axis deviation Low voltage, precordial leads Abnormal inferior Q waves Borderline prolonged QT interval Confirmed by Sherwood Gambler [phone]) on 03/10/2022 5:48:04 PM ? ?Radiology ?DG Chest 2 View ? ?Result Date: 03/10/2022 ?CLINICAL DATA:  Sepsis EXAM: CHEST - 2 VIEW COMPARISON:  03/06/2022 FINDINGS: The heart size and mediastinal contours are within normal limits. Both lungs are clear. The visualized skeletal structures are unremarkable. IMPRESSION: No active cardiopulmonary disease. Electronically Signed   By: Randa Ngo M.D.   On: 03/10/2022 17:46  ? ?CT HEAD WO CONTRAST (5MM) ? ?Result Date: 03/10/2022 ?CLINICAL DATA:  Mental status change EXAM: CT HEAD WITHOUT CONTRAST TECHNIQUE: Contiguous axial images were obtained from the base of the skull through the vertex without intravenous contrast. RADIATION DOSE REDUCTION: This exam was performed according to the departmental dose-optimization program which includes  automated exposure control, adjustment of the mA and/or kV according to patient size and/or use of iterative reconstruction technique. COMPARISON:  03/06/2022 FINDINGS: Brain: There is no mass, hemorrhage or extra-axial collection. There is generalized atrophy without lobar predilection. Hypodensity of the white matter is most commonly associated with chronic microvascular disease. Vascular: No abnormal hyperdensity of the major intracranial arteries or dural venous sinuses. No intracranial atherosclerosis. Skull: The visualized skull base, calvarium and extracranial soft tissues are normal. Sinuses/Orbits: No fluid levels or advanced mucosal thickening of the visualized paranasal sinuses. No mastoid or middle ear effusion. The orbits are normal. IMPRESSION:  1. No acute intracranial abnormality. 2. Chronic microvascular ischemia and generalized atrophy. Electronically Signed   By: Ulyses Jarred M.D.   On: 03/10/2022 20:42  ? ?CT PELVIS W CONTRAST ? ?Result Date: 03/10/2022 ?CLINICAL DATA:  Abdominal pain. Recent fall. Possible pelvic soft tissue infection. EXAM: CT ABDOMEN AND PELVIS WITH CONTRAST TECHNIQUE: Multidetector CT imaging of the abdomen and pelvis was performed using the standard protocol following bolus administration of intravenous contrast. RADIATION DOSE REDUCTION: This exam was performed according to the departmental dose-optimization program which includes automated exposure control, adjustment of the mA and/or kV according to patient size and/or use of iterative reconstruction technique. CONTRAST:  138m OMNIPAQUE IOHEXOL 300 MG/ML  SOLN COMPARISON:  None. FINDINGS: Lower chest:  Unremarkable. Hepatobiliary: No hepatic laceration or mass identified. Moderate steatosis noted. Several small calcified gallstones are seen, however there is no evidence of cholecystitis or biliary ductal dilatation. Pancreas: No parenchymal laceration, mass, or inflammatory changes identified. Spleen: No evidence of  splenic laceration. Adrenal/Urinary Tract: No hemorrhage or parenchymal lacerations identified. Tiny sub-centimeter left renal cyst noted. No evidence of mass or hydronephrosis. Unremarkable unopacified urinary bladder. Stoma

## 2022-03-10 NOTE — Assessment & Plan Note (Deleted)
-   The patient will be placed on supplement coverage with NovoLog. - We will hold off metformin. 

## 2022-03-10 NOTE — ED Notes (Signed)
Transportation called to transport pt ?

## 2022-03-11 ENCOUNTER — Encounter (HOSPITAL_COMMUNITY): Payer: Self-pay | Admitting: Family Medicine

## 2022-03-11 DIAGNOSIS — Z5900 Homelessness unspecified: Secondary | ICD-10-CM

## 2022-03-11 DIAGNOSIS — E876 Hypokalemia: Secondary | ICD-10-CM | POA: Diagnosis present

## 2022-03-11 DIAGNOSIS — R296 Repeated falls: Secondary | ICD-10-CM

## 2022-03-11 LAB — BASIC METABOLIC PANEL
Anion gap: 10 (ref 5–15)
BUN: 20 mg/dL (ref 8–23)
CO2: 26 mmol/L (ref 22–32)
Calcium: 8.4 mg/dL — ABNORMAL LOW (ref 8.9–10.3)
Chloride: 100 mmol/L (ref 98–111)
Creatinine, Ser: 1.41 mg/dL — ABNORMAL HIGH (ref 0.61–1.24)
GFR, Estimated: 55 mL/min — ABNORMAL LOW (ref >60)
Glucose, Bld: 204 mg/dL — ABNORMAL HIGH (ref 70–99)
Potassium: 2.7 mmol/L — CL (ref 3.5–5.1)
Sodium: 136 mmol/L (ref 135–145)

## 2022-03-11 LAB — CBC
HCT: 37.4 % — ABNORMAL LOW (ref 39.0–52.0)
Hemoglobin: 12.9 g/dL — ABNORMAL LOW (ref 13.0–17.0)
MCH: 29.8 pg (ref 26.0–34.0)
MCHC: 34.5 g/dL (ref 30.0–36.0)
MCV: 86.4 fL (ref 80.0–100.0)
Platelets: 154 10*3/uL (ref 150–400)
RBC: 4.33 MIL/uL (ref 4.22–5.81)
RDW: 13.3 % (ref 11.5–15.5)
WBC: 8.9 10*3/uL (ref 4.0–10.5)
nRBC: 0 % (ref 0.0–0.2)

## 2022-03-11 LAB — CBG MONITORING, ED: Glucose-Capillary: 216 mg/dL — ABNORMAL HIGH (ref 70–99)

## 2022-03-11 LAB — GLUCOSE, CAPILLARY
Glucose-Capillary: 176 mg/dL — ABNORMAL HIGH (ref 70–99)
Glucose-Capillary: 187 mg/dL — ABNORMAL HIGH (ref 70–99)
Glucose-Capillary: 169 mg/dL — ABNORMAL HIGH (ref 70–99)

## 2022-03-11 LAB — MAGNESIUM: Magnesium: 1.8 mg/dL (ref 1.7–2.4)

## 2022-03-11 LAB — HEMOGLOBIN A1C
Hgb A1c MFr Bld: 8.1 % — ABNORMAL HIGH (ref 4.8–5.6)
Mean Plasma Glucose: 185.77 mg/dL

## 2022-03-11 LAB — POTASSIUM: Potassium: 3.6 mmol/L (ref 3.5–5.1)

## 2022-03-11 MED ORDER — MAGNESIUM SULFATE 2 GM/50ML IV SOLN
2.0000 g | Freq: Once | INTRAVENOUS | Status: AC
  Administered 2022-03-11: 2 g via INTRAVENOUS
  Filled 2022-03-11: qty 50

## 2022-03-11 MED ORDER — INSULIN ASPART 100 UNIT/ML IJ SOLN
0.0000 [IU] | Freq: Three times a day (TID) | INTRAMUSCULAR | Status: DC
  Administered 2022-03-11 (×2): 2 [IU] via SUBCUTANEOUS
  Administered 2022-03-11 – 2022-03-12 (×2): 3 [IU] via SUBCUTANEOUS
  Administered 2022-03-12: 2 [IU] via SUBCUTANEOUS
  Administered 2022-03-12: 1 [IU] via SUBCUTANEOUS
  Administered 2022-03-12 – 2022-03-13 (×2): 2 [IU] via SUBCUTANEOUS
  Administered 2022-03-13: 3 [IU] via SUBCUTANEOUS

## 2022-03-11 MED ORDER — NYSTATIN 100000 UNIT/GM EX POWD
Freq: Two times a day (BID) | CUTANEOUS | Status: DC
  Filled 2022-03-11 (×2): qty 15

## 2022-03-11 MED ORDER — POTASSIUM CHLORIDE 20 MEQ PO PACK
40.0000 meq | PACK | Freq: Once | ORAL | Status: AC
  Administered 2022-03-11: 40 meq via ORAL
  Filled 2022-03-11: qty 2

## 2022-03-11 MED ORDER — DEXTROSE 5 % IV SOLN: 2.0000 g | Freq: Once | INTRAVENOUS | Status: DC

## 2022-03-11 NOTE — Assessment & Plan Note (Addendum)
TOC consulted and provided with a list of shelters ?

## 2022-03-11 NOTE — Assessment & Plan Note (Addendum)
Resolved

## 2022-03-11 NOTE — ED Notes (Signed)
Potassium 2.7 per lab, MD notified ?

## 2022-03-11 NOTE — Progress Notes (Signed)
? ? ? [facility] ?                                                                            ? ? ?Erik Turner, is a 66 y.o. male, DOB - 1956/10/01, SNK:[id number] ?Admit date - 03/10/2022    ?Outpatient Primary MD for the patient is [name], MD ? ?LOS - 1  days ? ? ? ?Brief summary  ? ?Patient is a 66 year old male with hypertension, type 2 diabetes mellitus presented with acute onset of altered mental status.  Patient reported that he had gone to [facility] ED after having a fall.  He was discharged to [facility] as he is homeless.  On the day of admission, noted to be very confused, no significant injuries.  He admitted to having diminished urine output without any dysuria, urinary frequency, urgency or flank pain. ?He was recently seen at [facility] for metabolic encephalopathy and hypertensive urgency.  MRI was concerning for ventriculomegaly and NPH.  ? ? ? ? ?Assessment & Plan  ? ? ?Assessment and Plan: ?Acute metabolic encephalopathy ?- Possibly due to dehydration, AKI ?-Currently resolved, alert and oriented x3 ?-PT evaluation. ? ?Acute kidney injury superimposed on CKD 3a ?-  secondary to volume depletion and dehydration.  Baseline creatinine appears to be close to 1.4, was 0.8 in 2018 ?- Creatinine 2.1 at the time of admission, placed on IV fluid hydration ?-Continue IV fluid hydration today, will follow renal function in a.m.,  Currently close to baseline. ? ?Candidiasis of skin ?- Continue nystatin powder ? 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  Advanced chronic small vessel white matter disease.  Per EDP note on 3/20, neurosurgery was consulted and they did not feel anything emergent was needed.  Patient had improved mentally. ?-Will obtain PT consult, if he has gait instability, will discuss with neurology/neurosurgery ? ? ?Type 2 diabetes mellitus  with chronic kidney disease, with long-term current use of insulin (HCC) ?- hold off on metformin ?-Uncontrolled with hyperglycemia, A1c 8.0 ?-For now continue sliding scale insulin.  At discharge, increase metformin to 1000 twice daily and add Amaryl 2 mg daily. ? ?Essential hypertension ?- Continue to hold lisinopril, HCTZ, Cozaar  ?-Continue Coreg, nifedipine XL.  Patient already on nifedipine, no need of amlodipine if additional need of antihypertensive, will place on hydralazine. ? ?BPH (benign prostatic hyperplasia) ?- Continue Avodart ? ?Homeless ?- TOC consult, patient reports that he is homeless ? ?Hypokalemia ?- Potassium 2.7, received potassium replacement, now improved to 3.6 ? ? ?Code Status: Full CODE STATUS ?DVT Prophylaxis:  enoxaparin (LOVENOX) injection 40 mg Start: 03/10/22 2130 ? ? ?Level of Care: Level of care: Med-Surg ?Family Communication:  ?Disposition Plan:     Remains inpatient appropriate: Homeless, creatinine not currently at baseline, hypokalemia.  PT evaluation pending. ? ?Procedures:  ?None ? ?Consultants:   ?None ? ?Antimicrobials:  ? ?Anti-infectives (From admission, onward)  ? ? None  ? ?  ? ? ? ?Medications ? ? carvedilol  3.125 mg Oral BID WC  ? cholecalciferol  1,000 Units Oral Daily  ? dutasteride  0.5 mg Oral Daily  ? enoxaparin (LOVENOX) injection  40 mg Subcutaneous Q24H  ? insulin aspart  0-9 Units Subcutaneous TID AC & HS  ? multivitamin with minerals  1 tablet Oral Daily  ? NIFEdipine  30 mg Oral Daily  ? nystatin   Topical BID  ? traZODone  50 mg Oral QHS  ? ? ? ?Subjective:  ? ?Erik Turner was seen and examined today.  Alert and oriented now, states he had falls and confusion before coming to the hospital.  No new issues overnight.  No nausea vomiting abdominal pain or any diarrhea.   ? ?Objective:  ? ?Vitals:  ? 03/11/22 1035 03/11/22 1040 03/11/22 1100 03/11/22 1138  ?BP: (!) 147/78  (!) 143/83 (!) 148/91  ?Pulse: 70 73 63 66  ?Resp: '16  15 16  '$ ?Temp:    98.2 ?F  (36.8 ?C)  ?TempSrc:    Oral  ?SpO2: 98% 98% 100% 98%  ? ?No intake or output data in the 24 hours ending 03/11/22 1234 ?There were no vitals filed for this visit. ? ? ?Exam ?General: Alert and oriented x 3, NAD ?Cardiovascular: S1 S2 auscultated,  RRR ?Respiratory: Clear to auscultation bilaterally ?Gastrointestinal: Soft, NT, ND, + bowel sounds.  Intact left lower quadrant colostomy bag. ?Ext: no pedal edema bilaterally ?Neuro: no new deficits ?Skin: Erythematous left groin and intertriginous area ?Psych: Normal affect and demeanor, alert and oriented x3  ? ? ?Data Reviewed:  I have personally reviewed following labs  ? ? ?CBC ?Lab Results  ?Component Value Date  ? WBC 8.9 03/11/2022  ? RBC 4.33 03/11/2022  ? HGB 12.9 (L) 03/11/2022  ? HCT 37.4 (L) 03/11/2022  ? MCV 86.4 03/11/2022  ? MCH 29.8 03/11/2022  ? PLT 154 03/11/2022  ? MCHC 34.5 03/11/2022  ? RDW 13.3 03/11/2022  ? LYMPHSABS 1.1 03/10/2022  ? MONOABS 0.7 03/10/2022  ? EOSABS 0.1 03/10/2022  ? BASOSABS 0.0 03/10/2022  ? ? ? ?Last metabolic panel ?Lab Results  ?Component Value Date  ? NA 136 03/11/2022  ? K 3.6 03/11/2022  ? CL 100 03/11/2022  ? CO2 26 03/11/2022  ? BUN 20 03/11/2022  ? CREATININE 1.41 (H) 03/11/2022  ? GLUCOSE 204 (H) 03/11/2022  ? GFRNONAA 55 (L) 03/11/2022  ? GFRAA >60 03/02/2017  ? CALCIUM 8.4 (L) 03/11/2022  ? PROT 7.0 03/10/2022  ? ALBUMIN 4.0 03/10/2022  ? BILITOT 1.6 (H) 03/10/2022  ? ALKPHOS 65 03/10/2022  ? AST 58 (H) 03/10/2022  ? ALT 43 03/10/2022  ? ANIONGAP 10 03/11/2022  ? ? ?CBG (last 3)  ?Recent Labs  ?  03/11/22 ?1036  ?GLUCAP 216*  ?  ? ? ?Coagulation Profile: ?Recent Labs  ?Lab 03/10/22 ?1626  ?INR 1.0  ? ? ? ?Radiology Studies: I have personally reviewed the imaging studies  ?DG Chest 2 View ? ?Result Date: 03/10/2022 ?IMPRESSION: No active cardiopulmonary disease. Electronically Signed   By: Randa Ngo M.D.   On: 03/10/2022 17:46  ? ?CT HEAD WO CONTRAST (5MM) ? ?Result Date: 03/10/2022 ? IMPRESSION: 1. No acute  intracranial abnormality. 2. Chronic microvascular ischemia and generalized atrophy. Electronically Signed   By: Ulyses Jarred M.D.   On: 03/10/2022 20:42  ? ?CT PELVIS W CONTRAST ? ?Result Date: 03/10/2022 ? IMPRESSION: No evidence of traumatic injury or other acute findings. Cholelithiasis. No radiographic evidence of cholecystitis. Hepatic steatosis. Electronically Signed   By: Marlaine Hind M.D.   On: 03/10/2022 17:52  ? ?CT ABDOMEN W  CONTRAST ? ?Result Date: 03/10/2022 ?IMPRESSION: No evidence of traumatic injury or other acute findings. Cholelithiasis. No radiographic evidence of cholecystitis. Hepatic steatosis. Electronically Signed   By: Marlaine Hind M.D.   On: 03/10/2022 17:52   ? ? ? ? ?Estill Cotta M.D. ?[facility] ?03/11/2022, 12:34 PM ? ?Available via Epic secure chat 7am-7pm ?After 7 pm, please refer to night coverage provider listed on amion. ? ?  ?

## 2022-03-11 NOTE — Hospital Course (Addendum)
66 year old M with PMH of DM-2, HTN, colostomy, recurrent fall and homelessness presented to ED with altered mental status admitted for encephalopathy, AKI and recurrent falls.  Patient had similar hospitalization at [facility] for encephalopathy and hypertensive urgency.  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   Patient's encephalopathy resolved with hydration.  Case discussed with neurology who recommended outpatient follow-up.  ? ?Patient has been started on antihyperglycemic and antihypertensive regimen.  Also started on atorvastatin.  His prescription was refilled at [facility] prior to discharge. ? ?He was evaluated by therapy and cleared.  He is discharged to go back to shelter.  ? ?

## 2022-03-11 NOTE — Evaluation (Signed)
Physical Therapy Evaluation ?Patient Details ?Name: Erik Turner ?MRN: [id number] ?DOB: Jun 11, 1956 ?Today's Date: 03/11/2022 ? ?History of Present Illness ? Pt is a 66 y/o male presenting 3/24 secondary to AMS and falls. Pt multiple visits to ED within the past few days. PMH includes HTN, DM, and prostate cancer.  ?Clinical Impression ? Pt admitted secondary to problem above with deficits below. Pt requiring min guard A for mobility tasks using RW. No overt LOB noted, but mobility limited secondary to back pain. Did note some memory deficits during session. Anticipate pt will progress well and will not require follow up PT. Will continue to follow acutely.    ?   ? ?Recommendations for follow up therapy are one component of a multi-disciplinary discharge planning process, led by the attending physician.  Recommendations may be updated based on patient status, additional functional criteria and insurance authorization. ? ?Follow Up Recommendations No PT follow up ? ?  ?Assistance Recommended at Discharge Intermittent Supervision/Assistance  ?Patient can return home with the following ? Assist for transportation ? ?  ?Equipment Recommendations Rolling walker (2 wheels)  ?Recommendations for Other Services ?    ?  ?Functional Status Assessment Patient has had a recent decline in their functional status and demonstrates the ability to make significant improvements in function in a reasonable and predictable amount of time.  ? ?  ?Precautions / Restrictions Precautions ?Precautions: Fall ?Restrictions ?Weight Bearing Restrictions: No  ? ?  ? ?Mobility ? Bed Mobility ?Overal bed mobility: Needs Assistance ?Bed Mobility: Supine to Sit, Sit to Supine ?  ?  ?Supine to sit: Min assist ?Sit to supine: Supervision ?  ?General bed mobility comments: Min A for trunk elevation to come to sitting on stretcher ?  ? ?Transfers ?Overall transfer level: Needs assistance ?Equipment used: Rolling walker (2 wheels) ?Transfers: Sit to/from  Stand ?Sit to Stand: Min guard ?  ?  ?  ?  ?  ?General transfer comment: Min guard for safety ?  ? ?Ambulation/Gait ?Ambulation/Gait assistance: Min guard ?Gait Distance (Feet): 30 Feet ?Assistive device: Rolling walker (2 wheels) ?Gait Pattern/deviations: Step-through pattern ?Gait velocity: Decreased ?  ?  ?General Gait Details: Reporting increased back pain which limited mobility tolerance. Min guard for safety. No LOB noted ? ?Stairs ?  ?  ?  ?  ?  ? ?Wheelchair Mobility ?  ? ?Modified Rankin (Stroke Patients Only) ?  ? ?  ? ?Balance Overall balance assessment: Needs assistance ?Sitting-balance support: Feet supported ?Sitting balance-Leahy Scale: Good ?  ?  ?Standing balance support: No upper extremity supported, During functional activity, Bilateral upper extremity supported ?Standing balance-Leahy Scale: Fair ?Standing balance comment: Maintained static standing without UE support ?  ?  ?  ?  ?  ?  ?  ?  ?  ?  ?  ?   ? ? ? ?Pertinent Vitals/Pain Pain Assessment ?Pain Assessment: No/denies pain  ? ? ?Home Living Family/patient expects to be discharged to:: Shelter/Homeless ?  ?  ?  ?  ?  ?  ?  ?  ?  ?   ?  ?Prior Function Prior Level of Function : Independent/Modified Independent ?  ?  ?  ?  ?  ?  ?Mobility Comments: Reports he received a RW, however, does not know where it is ?  ?  ? ? ?Hand Dominance  ?   ? ?  ?Extremity/Trunk Assessment  ? Upper Extremity Assessment ?Upper Extremity Assessment: Defer to OT evaluation ?  ? ?  Lower Extremity Assessment ?Lower Extremity Assessment: Generalized weakness ?  ? ?Cervical / Trunk Assessment ?Cervical / Trunk Assessment: Normal  ?Communication  ? Communication: No difficulties  ?Cognition Arousal/Alertness: Awake/alert ?Behavior During Therapy: [facility] for tasks assessed/performed ?Overall Cognitive Status: No family/caregiver present to determine baseline cognitive functioning ?  ?  ?  ?  ?  ?  ?  ?  ?  ?  ?  ?  ?  ?  ?  ?  ?General Comments: Some memory deficits noted.  A and O X4 ?  ?  ? ?  ?General Comments   ? ?  ?Exercises    ? ?Assessment/Plan  ?  ?PT Assessment Patient needs continued PT services  ?PT Problem List Decreased strength;Decreased balance;Decreased activity tolerance;Decreased mobility;Pain ? ?   ?  ?PT Treatment Interventions DME instruction;Gait training;Therapeutic activities;Functional mobility training;Balance training;Therapeutic exercise;Patient/family education   ? ?PT Goals (Current goals can be found in the Care Plan section)  ?Acute Rehab PT Goals ?Patient Stated Goal: to stop falling ?PT Goal Formulation: With patient ?Time For Goal Achievement: 03/25/22 ?Potential to Achieve Goals: Good ? ?  ?Frequency Min 3X/week ?  ? ? ?Co-evaluation   ?  ?  ?  ?  ? ? ?  ?AM-PAC PT "6 Clicks" Mobility  ?Outcome Measure Help needed turning from your back to your side while in a flat bed without using bedrails?: None ?Help needed moving from lying on your back to sitting on the side of a flat bed without using bedrails?: A Little ?Help needed moving to and from a bed to a chair (including a wheelchair)?: A Little ?Help needed standing up from a chair using your arms (e.g., wheelchair or bedside chair)?: A Little ?Help needed to walk in hospital room?: A Little ?Help needed climbing 3-5 steps with a railing? : A Little ?6 Click Score: 19 ? ?  ?End of Session Equipment Utilized During Treatment: Gait belt ?Activity Tolerance: Patient tolerated treatment well;Patient limited by fatigue ?Patient left: in bed;with call bell/phone within reach (on stretcher in ED) ?Nurse Communication: Mobility status ?PT Visit Diagnosis: Other abnormalities of gait and mobility (R26.89);History of falling (Z91.81);Repeated falls (R29.6) ?  ? ?Time: 5027-7412 ?PT Time Calculation (min) (ACUTE ONLY): 18 min ? ? ?Charges:   PT Evaluation ?$PT Eval Low Complexity: 1 Low ?  ?  ?   ? ? ?Reuel Derby, PT, DPT  ?Acute Rehabilitation Services  ?Pager: [phone] ?Office: [phone] ? ? ?[place] ?03/11/2022, 9:02 AM ?

## 2022-03-11 NOTE — ED Notes (Signed)
Message sent to pharmacy for potassium ? ?

## 2022-03-11 NOTE — Assessment & Plan Note (Addendum)
Recently hospitalized at [facility].  MRI brain on 02/20/2022 which showed findings concerning for NPH, no evidence of obstructive hydrocephalus or traumas ependymal edema.  Evaluated by neurology who recommended outpatient follow-up.  Ambulatory referral to neurology ordered. ?

## 2022-03-12 ENCOUNTER — Inpatient Hospital Stay (HOSPITAL_COMMUNITY): Payer: Medicare Other

## 2022-03-12 DIAGNOSIS — R4182 Altered mental status, unspecified: Secondary | ICD-10-CM

## 2022-03-12 DIAGNOSIS — R296 Repeated falls: Secondary | ICD-10-CM

## 2022-03-12 DIAGNOSIS — G9389 Other specified disorders of brain: Secondary | ICD-10-CM

## 2022-03-12 LAB — GLUCOSE, CAPILLARY
Glucose-Capillary: 139 mg/dL — ABNORMAL HIGH (ref 70–99)
Glucose-Capillary: 195 mg/dL — ABNORMAL HIGH (ref 70–99)
Glucose-Capillary: 198 mg/dL — ABNORMAL HIGH (ref 70–99)
Glucose-Capillary: 203 mg/dL — ABNORMAL HIGH (ref 70–99)
Glucose-Capillary: 136 mg/dL — ABNORMAL HIGH (ref 70–99)
Glucose-Capillary: 168 mg/dL — ABNORMAL HIGH (ref 70–99)

## 2022-03-12 LAB — BASIC METABOLIC PANEL
Anion gap: 11 (ref 5–15)
BUN: 14 mg/dL (ref 8–23)
CO2: 24 mmol/L (ref 22–32)
Calcium: 8.6 mg/dL — ABNORMAL LOW (ref 8.9–10.3)
Chloride: 102 mmol/L (ref 98–111)
Creatinine, Ser: 0.98 mg/dL (ref 0.61–1.24)
GFR, Estimated: >60 mL/min (ref >60)
Glucose, Bld: 205 mg/dL — ABNORMAL HIGH (ref 70–99)
Potassium: 3.1 mmol/L — ABNORMAL LOW (ref 3.5–5.1)
Sodium: 137 mmol/L (ref 135–145)

## 2022-03-12 MED ORDER — POTASSIUM CHLORIDE CRYS ER 20 MEQ PO TBCR
40.0000 meq | EXTENDED_RELEASE_TABLET | Freq: Once | ORAL | Status: AC
Start: 2022-03-12 — End: 2022-03-12
  Administered 2022-03-12: 40 meq via ORAL
  Filled 2022-03-12: qty 2

## 2022-03-12 MED ORDER — NIFEDIPINE ER OSMOTIC RELEASE 60 MG PO TB24
60.0000 mg | ORAL_TABLET | Freq: Every day | ORAL | Status: DC
Start: 2022-03-13 — End: 2022-03-13
  Filled 2022-03-12: qty 1

## 2022-03-12 MED ORDER — CARVEDILOL 6.25 MG PO TABS
6.2500 mg | ORAL_TABLET | Freq: Two times a day (BID) | ORAL | Status: DC
Start: 2022-03-12 — End: 2022-03-13
  Administered 2022-03-12: 6.25 mg via ORAL
  Filled 2022-03-12 (×2): qty 1

## 2022-03-12 NOTE — Evaluation (Signed)
Occupational Therapy Evaluation ?Patient Details ?Name: Erik Turner ?MRN: [id number] ?DOB: [birth year] ?Today's Date: 03/12/2022 ? ? ?History of Present Illness Pt is a 66 y/o male presenting 3/24 secondary to AMS and falls. Pt multiple visits to ED within the past few days. PMH includes HTN, DM, and prostate cancer.  ? ?Clinical Impression ?  ?Pt PTA: Pt homeless, reports independence has colostomy. Pt currently, Pt emptying his own colostomy bag standing in bathroom upon arrival. No AD required for mobility. Pt performing own ADL routine without physical assist. Pt is at baseline and modified independent with ADL and mobility at this time. No LOB episodes. Pt with pain in R shoulder from previous injury. Pt does not require skilled OT services at this time. OT signing off. ?   ? ?Recommendations for follow up therapy are one component of a multi-disciplinary discharge planning process, led by the attending physician.  Recommendations may be updated based on patient status, additional functional criteria and insurance authorization.  ? ?Follow Up Recommendations ? No OT follow up  ?  ?Assistance Recommended at Discharge None  ?Patient can return home with the following Assist for transportation ? ?  ?Functional Status Assessment ? Patient has had a recent decline in their functional status and demonstrates the ability to make significant improvements in function in a reasonable and predictable amount of time.  ?Equipment Recommendations ? None recommended by OT  ?  ?Recommendations for Other Services   ? ? ?  ?Precautions / Restrictions Precautions ?Precautions: Fall ?Restrictions ?Weight Bearing Restrictions: No  ? ?  ? ?Mobility Bed Mobility ?Overal bed mobility: Modified Independent ?Bed Mobility: Sit to Supine ?  ?  ?  ?Sit to supine: Modified independent (Device/Increase time) ?  ?General bed mobility comments: no physical assist ?  ? ?Transfers ?Overall transfer level: Needs assistance ?Equipment used:  None ?Transfers: Sit to/from Stand ?Sit to Stand: Modified independent (Device/Increase time) ?  ?  ?  ?  ?  ?General transfer comment: no physical assist; smooth movement ?  ? ?  ?Balance Overall balance assessment: Modified Independent, No apparent balance deficits (not formally assessed) ?Sitting-balance support: Feet supported ?Sitting balance-Leahy Scale: Good ?  ?  ?Standing balance support: No upper extremity supported, During functional activity, Bilateral upper extremity supported ?Standing balance-Leahy Scale: Fair ?Standing balance comment: Maintained static standing without UE support ?  ?  ?  ?  ?  ?  ?  ?  ?  ?  ?  ?   ? ?ADL either performed or assessed with clinical judgement  ? ?ADL Overall ADL's : Modified independent;At baseline ?  ?  ?  ?  ?  ?  ?  ?  ?  ?  ?  ?  ?  ?  ?  ?  ?  ?  ?  ?General ADL Comments: Pt emptying his own colostomy bag standing in bathroom upon arrival. No AD required for mobility. Pt performing own ADL routine without physical assist. Pt is at baseline and modified independent.  ? ? ? ?Vision Baseline Vision/History: 0 No visual deficits ?Ability to See in Adequate Light: 0 Adequate ?Patient Visual Report: No change from baseline ?Vision Assessment?: No apparent visual deficits  ?   ?Perception   ?  ?Praxis   ?  ? ?Pertinent Vitals/Pain Pain Assessment ?Pain Assessment: 0-10 ?Pain Score: 4  ?Pain Location: R shoulder (reports previous injury 1 year ago) ?Pain Descriptors / Indicators: Discomfort, Sore ?Pain Intervention(s): Monitored during session, Repositioned  ? ? ? ?  Hand Dominance Right ?  ?Extremity/Trunk Assessment Upper Extremity Assessment ?Upper Extremity Assessment: Overall WFL for tasks assessed;RUE deficits/detail ?RUE Deficits / Details: pain with extended use of R arm from previous shoulder injury. ?RUE Coordination: decreased gross motor ?  ?Lower Extremity Assessment ?Lower Extremity Assessment: Overall WFL for tasks assessed ?  ?Cervical / Trunk  Assessment ?Cervical / Trunk Assessment: Normal ?  ?Communication Communication ?Communication: No difficulties ?  ?Cognition Arousal/Alertness: Awake/alert ?Behavior During Therapy: [facility] for tasks assessed/performed ?Overall Cognitive Status: No family/caregiver present to determine baseline cognitive functioning ?  ?  ?  ?  ?  ?  ?  ?  ?  ?  ?  ?  ?  ?  ?  ?  ?General Comments: A/O x4. Appears intact. ?  ?  ?General Comments    ? ?  ?Exercises   ?  ?Shoulder Instructions    ? ? ?Home Living Family/patient expects to be discharged to:: Shelter/Homeless ?Living Arrangements: Alone ?  ?  ?  ?  ?  ?  ?  ?  ?  ?  ?  ?  ?  ?  ?  ?  ?  ? ?  ?Prior Functioning/Environment Prior Level of Function : Independent/Modified Independent ?  ?  ?  ?  ?  ?  ?Mobility Comments: Reports he received a RW, however, does not know where it is ?ADLs Comments: Independent; has colostomy and manages his own supplies/bag changes. ?  ? ?  ?  ?OT Problem List: Pain ?  ?   ?OT Treatment/Interventions:    ?  ?OT Goals(Current goals can be found in the care plan section) Acute Rehab OT Goals ?OT Goal Formulation: All assessment and education complete, DC therapy  ?OT Frequency:   ?  ? ?Co-evaluation   ?  ?  ?  ?  ? ?  ?AM-PAC OT "6 Clicks" Daily Activity     ?Outcome Measure Help from another person eating meals?: None ?Help from another person taking care of personal grooming?: None ?Help from another person toileting, which includes using toliet, bedpan, or urinal?: None ?Help from another person bathing (including washing, rinsing, drying)?: None ?Help from another person to put on and taking off regular upper body clothing?: None ?Help from another person to put on and taking off regular lower body clothing?: None ?6 Click Score: 24 ?  ?End of Session Nurse Communication: Mobility status ? ?Activity Tolerance: Patient tolerated treatment well ?Patient left: in bed;with call bell/phone within reach ? ?OT Visit Diagnosis: Other abnormalities of  gait and mobility (R26.89)  ?              ?Time: 1015-1030 ?OT Time Calculation (min): 15 min ?Charges:  OT General Charges ?$OT Visit: 1 Visit ?OT Evaluation ?$OT Eval Moderate Complexity: 1 Mod ? ?Jefferey Pica, OTR/L ?Acute Rehabilitation Services ?Office: [phone] ? ? ?Jenene Slicker Hani Campusano ?03/12/2022, 1:15 PM ?

## 2022-03-12 NOTE — Plan of Care (Signed)
  Problem: Nutrition: Goal: Adequate nutrition will be maintained Outcome: Progressing   Problem: Pain Managment: Goal: General experience of comfort will improve Outcome: Progressing   Problem: Safety: Goal: Ability to remain free from injury will improve Outcome: Progressing   

## 2022-03-12 NOTE — Progress Notes (Signed)
? ? ? [facility] ?                                                                            ? ? ?Erik Turner, is a 66 y.o. male, DOB - 01/09/1956, KYH:[id number] ?Admit date - 03/10/2022    ?Outpatient Primary MD for the patient is [name], MD ? ?LOS - 2  days ? ? ? ?Brief summary  ? ?Patient is a 66 year old male with hypertension, type 2 diabetes mellitus presented with acute onset of altered mental status.  Patient reported that he had gone to [facility] ED after having a fall.  He was discharged to [facility] as he is homeless.  On the day of admission, noted to be very confused, no significant injuries.  He admitted to having diminished urine output without any dysuria, urinary frequency, urgency or flank pain. ?He was recently seen at [facility] for metabolic encephalopathy and hypertensive urgency.  MRI was concerning for ventriculomegaly and NPH.  ? ? ? ? ?Assessment & Plan  ? ? ?Assessment and Plan: ?Acute metabolic encephalopathy ?- Possibly due to dehydration, AKI ?-Currently resolved, alert and oriented x3 ?-PT evaluation recommended rolling walker otherwise no PT follow-up.  Mobility was limited secondary to back pain otherwise minimal assist with rolling walker. ? ?Acute kidney injury superimposed on CKD 3a ?-  secondary to volume depletion and dehydration.  Baseline creatinine appears to be close to 1.4, was 0.8 in 2018 ?- Creatinine 2.1 on admission, improved to 0.98 with IV fluids.   ? ?Candidiasis of skin ?- Continue nystatin powder ? 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  Advanced chronic small vessel white matter disease.  Per EDP note on 3/20, neurosurgery was consulted and they did not feel anything emergent was needed and had recommended outpatient neurology follow-up.  Patient had improved mentally. ?-PT evaluation shows minimal assistance.   Will request neurology's assistance if high-volume LP is warranted? ?-Patient also reported upper back pain as noted by PT evaluation, will obtain x-rays of the thoracic spine. ? ?Type 2 diabetes mellitus with chronic kidney disease, with long-term current use of insulin (HCC) ?- hold off on metformin ?-Uncontrolled with hyperglycemia, A1c 8.0 ?-For now continue sliding scale insulin.  At discharge, increase metformin to 1000 twice daily and add Amaryl 2 mg daily. ? ?Essential hypertension ?- BP now elevated, increase Coreg to 6.25 mg twice daily, nifedipine to 60 mg daily ?-Resume lisinopril if BP still elevated. ? ? ?BPH (benign prostatic hyperplasia) ?- Continue Avodart ? ?Homeless ?- TOC consult, patient reports that he is homeless, has been living in his car for the last 7 months. ?-Requested [facility] consult for resources, he is willing to go to a shelter. ? ?Hypokalemia ?- K3.1, replaced ? ? ?Code Status: Full CODE STATUS ?DVT Prophylaxis:  enoxaparin (LOVENOX) injection 40 mg Start: 03/10/22 2130 ? ? ?Level of Care: Level of care: Med-Surg ?Family Communication:  ?Disposition Plan:     Remains inpatient appropriate: Patient is homeless, [facility] consulted for resources, he is willing to go to shelter.  So far he has been  living in his car for last 7 months. ? ?Procedures:  ? ? ?Consultants:   ? ? ?Antimicrobials: None ? ?Medications ? ? carvedilol  6.25 mg Oral BID WC  ? cholecalciferol  1,000 Units Oral Daily  ? dutasteride  0.5 mg Oral Daily  ? enoxaparin (LOVENOX) injection  40 mg Subcutaneous Q24H  ? insulin aspart  0-9 Units Subcutaneous TID AC & HS  ? multivitamin with minerals  1 tablet Oral Daily  ? [START ON 03/13/2022] NIFEdipine  60 mg Oral Daily  ? nystatin   Topical BID  ? potassium chloride  40 mEq Oral Once  ? traZODone  50 mg Oral QHS  ? ? ? ?Subjective:  ? ?Erik Turner was seen and examined today.  Complaining of upper back pain, worse when he is ambulating.  Has had 3 recent falls.  Patient denies  dizziness, chest pain, shortness of breath, abdominal pain, N/V/D/C, new weakness, numbess, tingling. No acute events overnight.   ? ?Objective:  ? ?Vitals:  ? 03/11/22 1551 03/11/22 1959 03/12/22 0622 03/12/22 0752  ?BP: (!) 156/92 135/78 (!) 169/97 (!) 185/95  ?Pulse: 92 62 64 62  ?Resp: '16 18 17 17  '$ ?Temp: 98 ?F (36.7 ?C) 98.2 ?F (36.8 ?C) 97.7 ?F (36.5 ?C) 98.1 ?F (36.7 ?C)  ?TempSrc: Oral Oral    ?SpO2: 94% 98% 98% 99%  ? ? ?Intake/Output Summary (Last 24 hours) at 03/12/2022 1257 ?Last data filed at 03/12/2022 0300 ?Gross per 24 hour  ?Intake 966.15 ml  ?Output --  ?Net 966.15 ml  ? ?There were no vitals filed for this visit. ? ? ?Exam ?General: Alert and oriented x 3, NAD ?Cardiovascular: S1 S2 auscultated, RRR ?Respiratory: Clear to auscultation bilaterally, no wheezing, rales ?Gastrointestinal: Soft, NT, ND, LLQ colostomy ?Ext: no pedal edema bilaterally ?Neuro: no new deficits ?Psych: Normal affect and demeanor, alert and oriented x3  ? ? ?Data Reviewed:  I have personally reviewed following labs  ? ? ?CBC ?Lab Results  ?Component Value Date  ? WBC 8.9 03/11/2022  ? RBC 4.33 03/11/2022  ? HGB 12.9 (L) 03/11/2022  ? HCT 37.4 (L) 03/11/2022  ? MCV 86.4 03/11/2022  ? MCH 29.8 03/11/2022  ? PLT 154 03/11/2022  ? MCHC 34.5 03/11/2022  ? RDW 13.3 03/11/2022  ? LYMPHSABS 1.1 03/10/2022  ? MONOABS 0.7 03/10/2022  ? EOSABS 0.1 03/10/2022  ? BASOSABS 0.0 03/10/2022  ? ? ? ?Last metabolic panel ?Lab Results  ?Component Value Date  ? NA 137 03/12/2022  ? K 3.1 (L) 03/12/2022  ? CL 102 03/12/2022  ? CO2 24 03/12/2022  ? BUN 14 03/12/2022  ? CREATININE 0.98 03/12/2022  ? GLUCOSE 205 (H) 03/12/2022  ? GFRNONAA >60 03/12/2022  ? GFRAA >60 03/02/2017  ? CALCIUM 8.6 (L) 03/12/2022  ? PROT 7.0 03/10/2022  ? ALBUMIN 4.0 03/10/2022  ? BILITOT 1.6 (H) 03/10/2022  ? ALKPHOS 65 03/10/2022  ? AST 58 (H) 03/10/2022  ? ALT 43 03/10/2022  ? ANIONGAP 11 03/12/2022  ? ? ?CBG (last 3)  ?Recent Labs  ?  03/12/22 ?0754 03/12/22 ?1108  03/12/22 ?1123  ?GLUCAP 139* 198* 195*  ?  ? ? ?Coagulation Profile: ?Recent Labs  ?Lab 03/10/22 ?1626  ?INR 1.0  ? ? ? ?Radiology Studies: I have personally reviewed the imaging studies  ?DG Chest 2 View ? ?Result Date: 03/10/2022 ?IMPRESSION: No active cardiopulmonary disease. Electronically Signed   By: Randa Ngo M.D.   On: 03/10/2022 17:46  ? ?CT HEAD  WO CONTRAST (5MM) ? ?Result Date: 03/10/2022 ? IMPRESSION: 1. No acute intracranial abnormality. 2. Chronic microvascular ischemia and generalized atrophy. Electronically Signed   By: Ulyses Jarred M.D.   On: 03/10/2022 20:42  ? ?CT PELVIS W CONTRAST ? ?Result Date: 03/10/2022 ? IMPRESSION: No evidence of traumatic injury or other acute findings. Cholelithiasis. No radiographic evidence of cholecystitis. Hepatic steatosis. Electronically Signed   By: Marlaine Hind M.D.   On: 03/10/2022 17:52  ? ?CT ABDOMEN W CONTRAST ? ?Result Date: 03/10/2022 ? IMPRESSION: No evidence of traumatic injury or other acute findings. Cholelithiasis. No radiographic evidence of cholecystitis. Hepatic steatosis. Electronically Signed   By: Marlaine Hind M.D.   On: 03/10/2022 17:52   ? ? ? ? ?Estill Cotta M.D. ?[facility] ?03/12/2022, 12:57 PM ? ?Available via Epic secure chat 7am-7pm ?After 7 pm, please refer to night coverage provider listed on amion. ? ?  ?

## 2022-03-12 NOTE — Consult Note (Signed)
Neurology Consultation ? ?Reason for Consult: ventriculomegaly and hydrocephalus ?Referring Physician: Dr. Tana Coast  ? ?CC: AMS  ? ?History is obtained from:medical record ? ?HPI: Erik Turner is a 66 y.o. male with past medical history of HTN, and DM who presented to the hospital for acute onset of AMS. He has had multiple visits to the ED in the last month. He is homeless. He admits to having multiple falls but relates this to his dehydration. He was recently seen at [facility] at the beginning of the month for metabolic cephalopathy and hypertensive urgency and had an MRI at that was concerning than for ventriculomegaly and NPH. Neurology consulted for assistance  ? ?ROS: Unable to obtain due to altered mental status.  ? ?Past Medical History:  ?Diagnosis Date  ? Hypertension   ? ? ?History reviewed. No pertinent family history. ? ?Social History:  ? reports that he quit smoking about 49 years ago. His smoking use included cigarettes. He has quit using smokeless tobacco. He reports that he does not currently use alcohol after a past usage of about 1.0 standard drink per week. He reports that he does not use drugs. ? ?Medications ? ?Current Facility-Administered Medications:  ?  0.9 %  sodium chloride infusion, , Intravenous, Continuous, Rai, Ripudeep K, MD, Last Rate: 125 mL/hr at 03/12/22 0009, New Bag at 03/12/22 0009 ?  acetaminophen (TYLENOL) tablet 650 mg, 650 mg, Oral, Q6H PRN, 650 mg at 03/11/22 2320 **OR** acetaminophen (TYLENOL) suppository 650 mg, 650 mg, Rectal, Q6H PRN, Mansy, Jan A, MD ?  carvedilol (COREG) tablet 6.25 mg, 6.25 mg, Oral, BID WC, Rai, Ripudeep K, MD ?  cholecalciferol (VITAMIN D3) tablet 1,000 Units, 1,000 Units, Oral, Daily, Mansy, Arvella Merles, MD, 1,000 Units at 03/12/22 7846 ?  dutasteride (AVODART) capsule 0.5 mg, 0.5 mg, Oral, Daily, Mansy, Jan A, MD, 0.5 mg at 03/12/22 9629 ?  enoxaparin (LOVENOX) injection 40 mg, 40 mg, Subcutaneous, Q24H, Mansy, Jan A, MD, 40 mg at 03/11/22  2202 ?  insulin aspart (novoLOG) injection 0-9 Units, 0-9 Units, Subcutaneous, TID AC & HS, Mansy, Jan A, MD, 2 Units at 03/12/22 1123 ?  magnesium hydroxide (MILK OF MAGNESIA) suspension 30 mL, 30 mL, Oral, Daily PRN, Mansy, Jan A, MD ?  multivitamin with minerals tablet 1 tablet, 1 tablet, Oral, Daily, Mansy, Arvella Merles, MD, 1 tablet at 03/12/22 5284 ?  [START ON 03/13/2022] NIFEdipine (PROCARDIA XL/NIFEDICAL XL) 24 hr tablet 60 mg, 60 mg, Oral, Daily, Rai, Ripudeep K, MD ?  nystatin (MYCOSTATIN/NYSTOP) topical powder, , Topical, BID, Mansy, Arvella Merles, MD, Given at 03/12/22 [phone] ?  ondansetron (ZOFRAN) tablet 4 mg, 4 mg, Oral, Q6H PRN **OR** ondansetron (ZOFRAN) injection 4 mg, 4 mg, Intravenous, Q6H PRN, Mansy, Jan A, MD ?  traZODone (DESYREL) tablet 25 mg, 25 mg, Oral, QHS PRN, Mansy, Jan A, MD ?  traZODone (DESYREL) tablet 50 mg, 50 mg, Oral, QHS, Mansy, Jan A, MD, 50 mg at 03/11/22 2203 ?  zolpidem (AMBIEN) tablet 5 mg, 5 mg, Oral, QHS PRN, Mansy, Arvella Merles, MD ? ? ?Exam: ?Current vital signs: ?BP (!) 167/85 (BP Location: Left Wrist)   Pulse 72   Temp 98.2 ?F (36.8 ?C) (Oral)   Resp 18   SpO2 100%  ?Vital signs in last 24 hours: ?Temp:  [97.7 ?F (36.5 ?C)-98.2 ?F (36.8 ?C)] 98.2 ?F (36.8 ?C) (03/26 1605) ?Pulse Rate:  [62-72] 72 (03/26 1605) ?Resp:  [17-18] 18 (03/26 1605) ?BP: (135-185)/(78-97) 167/85 (03/26 1605) ?  SpO2:  [98 %-100 %] 100 % (03/26 1605) ? ?GENERAL: Awake, alert in NAD ?HEENT: - Normocephalic and atraumatic, dry mm ?LUNGS - Clear to auscultation bilaterally with no wheezes ?CV - S1S2 RRR, no m/r/g, equal pulses bilaterally. ?ABDOMEN - Soft, nontender, nondistended with normoactive BS ?Ext: has an abrasion on left outter portion of leg warm, well perfused, intact peripheral pulses, no  edema ? ?NEURO:  ?Mental Status: AA&Ox2. Not able to state place or city or month. No able to give a coherent history of events ?Language: speech is clear.  ?Cranial Nerves: PERRL 3 mm/brisk. EOMI, visual fields full, no  facial asymmetry, facial sensation intact, hearing intact, tongue/uvula/soft palate midline, norma sternocleidomastoid and trapezius muscle strength. No evidence of tongue atrophy or fibrillations ?Motor: 5/5 in all 4 extremities ?Tone: is normal and bulk is normal ?Sensation- Intact to light touch bilaterally ?Coordination: FTN intact bilaterally, no ataxia in BLE. ?Gait- deferred ? ? ?Imaging ?I have reviewed the images obtained: ? ?CT-head 3/24: ?1. No acute intracranial abnormality. ?2. Chronic microvascular ischemia and generalized atrophy ? ?MRI w/wo examination of the brain  ?02/18/2022 @ [facility]  ?1. 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 Recommend neurosurgical evaluation. No evidence of obstructive hydrocephalus or transependymal edema. ?2. Disproportionate parenchymal volume loss involving the midbrain tegmentum is nonspecific but could reflect sequelae of progressive supranuclear palsy in the appropriate clinical setting. ?3. Background of advanced chronic small vessel white matter disease and multiple remote petechial hemorrhages most pronounced in the thalami compatible sequelae of chronic hypertension. ? ?Assessment:  ?Erik Turner is a 66 y.o. male with past medical history of HTN, and DM who presented to the hospital for acute onset of AMS. He has had multiple visits to the ED in the last month. He is homeless. He admits to having multiple falls but relates this to his dehydration. He was recently seen at [facility] at the beginning of the month for metabolic cephalopathy and hypertensive urgency and had an MRI at that was concerning than for ventriculomegaly and NPH ? ?PT/OT eval no follow up needed  ? ?Recommendations: ?-Due to patient doing fairly well, ventriculomegaly and hydrocephalus can be followed up with outpatient neurology. ?- Will need outpatient follow up with neurology when discharged ?- Neurology will sign off at this time. Please call  with questions and concerns  ? ?Beulah Gandy DNP, ACNPC-AG  ? ?

## 2022-03-13 ENCOUNTER — Other Ambulatory Visit (HOSPITAL_COMMUNITY): Payer: Self-pay

## 2022-03-13 DIAGNOSIS — E876 Hypokalemia: Secondary | ICD-10-CM

## 2022-03-13 DIAGNOSIS — E119 Type 2 diabetes mellitus without complications: Secondary | ICD-10-CM

## 2022-03-13 DIAGNOSIS — N400 Enlarged prostate without lower urinary tract symptoms: Secondary | ICD-10-CM

## 2022-03-13 DIAGNOSIS — Z5900 Homelessness unspecified: Secondary | ICD-10-CM

## 2022-03-13 LAB — RENAL FUNCTION PANEL
Albumin: 3.2 g/dL — ABNORMAL LOW (ref 3.5–5.0)
Anion gap: 7 (ref 5–15)
BUN: 9 mg/dL (ref 8–23)
CO2: 30 mmol/L (ref 22–32)
Calcium: 8.6 mg/dL — ABNORMAL LOW (ref 8.9–10.3)
Chloride: 102 mmol/L (ref 98–111)
Creatinine, Ser: 0.84 mg/dL (ref 0.61–1.24)
GFR, Estimated: >60 mL/min (ref >60)
Glucose, Bld: 163 mg/dL — ABNORMAL HIGH (ref 70–99)
Phosphorus: 2.6 mg/dL (ref 2.5–4.6)
Potassium: 3.6 mmol/L (ref 3.5–5.1)
Sodium: 139 mmol/L (ref 135–145)

## 2022-03-13 LAB — GLUCOSE, CAPILLARY
Glucose-Capillary: 158 mg/dL — ABNORMAL HIGH (ref 70–99)
Glucose-Capillary: 246 mg/dL — ABNORMAL HIGH (ref 70–99)

## 2022-03-13 LAB — MAGNESIUM: Magnesium: 1.4 mg/dL — ABNORMAL LOW (ref 1.7–2.4)

## 2022-03-13 MED ORDER — ATORVASTATIN CALCIUM 20 MG PO TABS
20.0000 mg | ORAL_TABLET | Freq: Every day | ORAL | 1 refills | Status: AC
  Filled 2022-03-13: qty 90, 90d supply, fill #0

## 2022-03-13 MED ORDER — ACETAMINOPHEN 325 MG PO TABS: 650.0000 mg | ORAL_TABLET | Freq: Four times a day (QID) | ORAL | Status: DC | PRN

## 2022-03-13 MED ORDER — HYDRALAZINE HCL 25 MG PO TABS: 25.0000 mg | ORAL_TABLET | Freq: Four times a day (QID) | ORAL | Status: DC | PRN

## 2022-03-13 MED ORDER — MAGNESIUM SULFATE 2 GM/50ML IV SOLN
2.0000 g | Freq: Once | INTRAVENOUS | Status: AC
  Administered 2022-03-13: 2 g via INTRAVENOUS
  Filled 2022-03-13: qty 50

## 2022-03-13 MED ORDER — ADULT MULTIVITAMIN W/MINERALS CH: 1.0000 | ORAL_TABLET | Freq: Every day | ORAL | Status: DC

## 2022-03-13 MED ORDER — AMLODIPINE BESYLATE 10 MG PO TABS
10.0000 mg | ORAL_TABLET | Freq: Every day | ORAL | 1 refills | Status: DC
  Filled 2022-03-13: qty 90, 90d supply, fill #0

## 2022-03-13 MED ORDER — LOSARTAN POTASSIUM 50 MG PO TABS
50.0000 mg | ORAL_TABLET | Freq: Every day | ORAL | 1 refills | Status: DC
  Filled 2022-03-13: qty 90, 90d supply, fill #0

## 2022-03-13 MED ORDER — LOSARTAN POTASSIUM 50 MG PO TABS
50.0000 mg | ORAL_TABLET | Freq: Every day | ORAL | Status: DC
  Administered 2022-03-13: 50 mg via ORAL
  Filled 2022-03-13: qty 1

## 2022-03-13 MED ORDER — AMLODIPINE BESYLATE 10 MG PO TABS
10.0000 mg | ORAL_TABLET | Freq: Every day | ORAL | Status: DC
  Administered 2022-03-13: 10 mg via ORAL
  Filled 2022-03-13: qty 1

## 2022-03-13 MED ORDER — GLIPIZIDE 5 MG PO TABS
5.0000 mg | ORAL_TABLET | Freq: Two times a day (BID) | ORAL | 11 refills | Status: DC
  Filled 2022-03-13: qty 60, 30d supply, fill #0

## 2022-03-13 MED ORDER — METFORMIN HCL 500 MG PO TABS
500.0000 mg | ORAL_TABLET | Freq: Two times a day (BID) | ORAL | 1 refills | Status: AC
  Filled 2022-03-13: qty 180, 90d supply, fill #0

## 2022-03-13 MED ORDER — VITAMIN D 1000 UNITS PO TABS: 1000.0000 [IU] | ORAL_TABLET | Freq: Every day | ORAL | Status: DC

## 2022-03-13 NOTE — Discharge Summary (Signed)
? ?Physician Discharge Summary  ?Erik Turner SNK:[id number] DOB: 06/29/1956 DOA: 03/10/2022 ? ?PCP: [name], MD ? ?Admit date: 03/10/2022 ?Discharge date: 03/13/2022 ?Admitted From: Homeless ?Disposition: Homeless shelter ?Recommendations for Outpatient Follow-up:  ?Follow ups as below. ?Please obtain CBC/BMP/Mag at follow up ?Please follow up on the following pending results: None ? ?Home Health: None ?Equipment/Devices: Patient has rolling walker ? ?Discharge Condition: Stable ?CODE STATUS: Full code ? Follow-up Information   ? ? [name], MD. Schedule an appointment as soon as possible for a visit in 1 week(s).   ?Specialty: Family Medicine ?Contact information: ?[address] ?[address] ?[place] [address] ?[phone] ? ? ?  ?  ? ? [facility]. Schedule an appointment as soon as possible for a visit in 4 week(s).   ?Specialty: Neurology ?Contact information: ?[address] ?[phone] ? ?  ?  ? ?  ?  ? ?  ? ? ?Hospital course ?66 year old M with PMH of DM-2, HTN, colostomy, recurrent fall and homelessness presented to ED with altered mental status admitted for encephalopathy, AKI and recurrent falls.  Patient had similar hospitalization at [facility] for encephalopathy and hypertensive urgency.  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   Patient's encephalopathy resolved with hydration.  Case discussed with neurology who recommended outpatient follow-up.  ? ?Patient has been started on antihyperglycemic and antihypertensive regimen.  Also started on atorvastatin.  His prescription was refilled at [facility] prior to discharge. ? ?He was evaluated by therapy and cleared.  He is discharged to go back to shelter.  ?  ? ?See individual problem list below for more on hospital course. ? ?Problems addressed during this hospitalization ?Problem  ?Acute Metabolic  Encephalopathy  ?Acute kidney injury superimposed on CKD 3a  ?Recurrent Falls  ?Essential Hypertension  ?Diabetes Mellitus Type II, Non Insulin Dependent (Hcc)  ?Bph (Benign Prostatic Hyperplasia)  ?Homeless  ?Hypokalemia  ?Hypomagnesemia  ?  ?Assessment and Plan: ?Acute metabolic encephalopathy ?Likely due to dehydration and AKI.  Resolved. ? ?Acute kidney injury superimposed on CKD 3a ?Recent Labs  ?  03/06/22 ?1544 03/10/22 ?1626 03/11/22 ?0108 03/12/22 ?0225 03/13/22 ?6222  ?BUN 26* 24* '20 14 9  '$ ?CREATININE 1.46* 2.11* 1.41* 0.98 0.84  ?Likely from dehydration.  Resolved. ? ? ?Recurrent falls ?Recently hospitalized at [facility].  MRI brain on 02/20/2022 which showed findings concerning for NPH, no evidence of obstructive hydrocephalus or traumas ependymal edema.  Evaluated by neurology who recommended outpatient follow-up.  Ambulatory referral to neurology ordered. ? ?Diabetes mellitus type II, non insulin dependent ([place]) ?Uncontrolled NIDDM-2 with hyperglycemia.  A1c 8.1%. ?Recent Labs  ?Lab 03/12/22 ?1608 03/12/22 ?2040 03/12/22 ?2112 03/13/22 ?0759 03/13/22 ?1153  ?GLUCAP 203* 168* 136* 158* 246*  ?-Discharged on metformin and glipizide ?-Also started on atorvastatin and losartan. ? ?Essential hypertension ?BP improved after discontinuing IV IV fluid and starting amlodipine and losartan. ?-Discontinued Coreg due to bradycardia.  Discontinue nifedipine as well. ? ? ?BPH (benign prostatic hyperplasia) ?Not taking Avodart at home. ? ? ? ?Homeless ?TOC consulted and provided with a list of shelters ? ?Hypokalemia ?Resolved. ? ? ?Hypomagnesemia ?Replenished prior to discharge. ? ? ? ? ?  ?  ?  ?  ? ?  ? ?Vital signs ?Vitals:  ? 03/12/22 2107 03/13/22 0619 03/13/22 0822 03/13/22 1555  ?BP: (!) 146/79 (!) 162/83 (!) 176/85 (!) 157/83  ?Pulse: (!) 51 Marland Kitchen)  48 (!) 59 68  ?Temp: 98.2 ?F (36.8 ?C) 98.2 ?F (36.8 ?C) 98.1 ?F (36.7 ?C) 98.3 ?F (36.8 ?C)  ?Resp: '18 18 18 18  '$ ?SpO2: 97% 99% 96% 98%  ?TempSrc: Oral Oral Oral  Oral  ?  ? ?Discharge exam ? ?GENERAL: No apparent distress.  Nontoxic. ?HEENT: MMM.  Vision and hearing grossly intact.  ?NECK: Supple.  No apparent JVD.  ?RESP:  No IWOB.  Fair aeration bilaterally. ?CVS:  RRR. Heart sounds normal.  ?ABD/GI/GU: BS+. Abd soft, NTND.  Colostomy bag in place. ?MSK/EXT:  Moves extremities. No apparent deformity. No edema.  ?SKIN: no apparent skin lesion or wound ?NEURO: Awake and alert. Oriented appropriately.  No apparent focal neuro deficit. ?PSYCH: Calm. Normal affect.  ? ?Discharge Instructions ?Discharge Instructions   ? ? Ambulatory referral to Neurology   Complete by: As directed ?  ? An appointment is requested in approximately: 4 weeks  ? Diet - low sodium heart healthy   Complete by: As directed ?  ? Diet Carb Modified   Complete by: As directed ?  ? Discharge instructions   Complete by: As directed ?  ? It has been a pleasure taking care of you! ? ?You were hospitalized with confusion/altered mental status likely from dehydration and acute kidney injury.  Your symptoms improved and your kidney recovered.  We have started you on diabetic, blood pressure and cholesterol medication during this hospital.  Please review your new medication list and the directions on your medications before you take them. ? ?Follow-up with your primary care doctor 1 to 2 weeks. ? ?We have sent referral to neurology.  Someone will get in touch with you in the next 1 to 2 weeks. ? ? ?Take care,  ? Increase activity slowly   Complete by: As directed ?  ? ?  ? ?Allergies as of 03/13/2022   ? ?   Reactions  ? Penicillins Itching  ? Patient states that all family members are allergic to penicillin & that he has been told by another physician to avoid penicillin.   ? ?  ? ?  ?Medication List  ?  ? ?STOP taking these medications   ? ?zolpidem 10 MG tablet ?Commonly known as: AMBIEN ?  ? ?  ? ?TAKE these medications   ? ?acetaminophen 325 MG tablet ?Commonly known as: TYLENOL ?Take 2 tablets (650 mg total)  by mouth every 6 (six) hours as needed for mild pain (or Fever >/= 101). ?  ?amLODipine 10 MG tablet ?Commonly known as: NORVASC ?Take 1 tablet (10 mg total) by mouth daily. ?  ?atorvastatin 20 MG tablet ?Commonly known as: Lipitor ?Take 1 tablet (20 mg total) by mouth daily. ?  ?cholecalciferol 1000 units tablet ?Commonly known as: VITAMIN D ?Take 1 tablet (1,000 Units total) by mouth daily. ?  ?glipiZIDE 5 MG tablet ?Commonly known as: GLUCOTROL ?Take 1 tablet (5 mg total) by mouth 2 (two) times daily. ?  ?losartan 50 MG tablet ?Commonly known as: COZAAR ?Take 1 tablet (50 mg total) by mouth daily. ?  ?metFORMIN 500 MG tablet ?Commonly known as: Glucophage ?Take 1 tablet (500 mg total) by mouth 2 (two) times daily with a meal. ?  ?multivitamin with minerals Tabs tablet ?Take 1 tablet by mouth daily. ?  ? ?  ? ?  ?  ? ? ?  ?Durable Medical Equipment  ?(From admission, onward)  ?  ? ? ?  ? ?  Start  Ordered  ? 03/12/22 0951  For home use only DME Walker rolling  Once       ?Question Answer Comment  ?Walker: With 5 Inch Wheels   ?Patient needs a walker to treat with the following condition Gait instability   ?  ? 03/12/22 0950  ? ?  ?  ? ?  ? ? ?Consultations: ?Neurology ? ?Procedures/Studies: ? ? ?DG Chest 2 View ? ?Result Date: 03/10/2022 ?CLINICAL DATA:  Sepsis EXAM: CHEST - 2 VIEW COMPARISON:  03/06/2022 FINDINGS: The heart size and mediastinal contours are within normal limits. Both lungs are clear. The visualized skeletal structures are unremarkable. IMPRESSION: No active cardiopulmonary disease. Electronically Signed   By: Randa Ngo M.D.   On: 03/10/2022 17:46  ? ?DG Chest 2 View ? ?Result Date: 03/06/2022 ?CLINICAL DATA:  Status post fall with knee pain. EXAM: CHEST - 2 VIEW COMPARISON:  None. FINDINGS: The heart size and mediastinal contours are within normal limits. Both lungs are clear. The visualized skeletal structures are unremarkable. IMPRESSION: No active cardiopulmonary disease. Electronically  Signed   By: Abelardo Diesel M.D.   On: 03/06/2022 16:41  ? ?DG Thoracic Spine 2 View ? ?Result Date: 03/12/2022 ?CLINICAL DATA:  Midthoracic back pain, pain in shoulder blades EXAM: THORACIC SPINE 2 VIEWS

## 2022-03-13 NOTE — Progress Notes (Signed)
Physical Therapy Treatment ?Patient Details ?Name: Erik Turner ?MRN: [id number] ?DOB: 30-Oct-1956 ?Today's Date: 03/13/2022 ? ? ?History of Present Illness Pt is a 66 y/o male presenting 3/24 secondary to AMS and falls. Pt multiple visits to ED within the past few days. PMH includes HTN, DM, and prostate cancer. ? ?  ?PT Comments  ? ? Pt was seen for mobility on RW in room and demonstrates some mild impulsive tendency to turn quickly, although he can manage safety with minor cues.  Followed up on his complaints of pain on R scapula with some instruction for stretch and repositioning on his scapular posture.  Pt is demonstrating understanding but would benefit from outpatient therapy for strengthening and pain reduction.  He is reporting a recent strain on the shoulder throwing a bag of trash, which in the six week window since this has happened would be a good time to intervene.  Follow along for therapy as his stay permits.   ?Recommendations for follow up therapy are one component of a multi-disciplinary discharge planning process, led by the attending physician.  Recommendations may be updated based on patient status, additional functional criteria and insurance authorization. ? ?Follow Up Recommendations ? Outpatient PT ?  ?  ?Assistance Recommended at Discharge Intermittent Supervision/Assistance  ?Patient can return home with the following Assist for transportation ?  ?Equipment Recommendations ? Rolling walker (2 wheels)  ?  ?Recommendations for Other Services   ? ? ?  ?Precautions / Restrictions Precautions ?Precautions: Fall ?Precaution Comments: pt has pain on R scapula from a strain lifting a trashbag ?Restrictions ?Weight Bearing Restrictions: No ?Other Position/Activity Restrictions: monitor injury to R scapula  ?  ? ?Mobility ? Bed Mobility ?Overal bed mobility: Modified Independent ?  ?  ?  ?  ?  ?  ?  ?  ? ?Transfers ?Overall transfer level: Needs assistance ?Equipment used: Rolling walker (2  wheels) ?Transfers: Sit to/from Stand ?Sit to Stand: Modified independent (Device/Increase time) ?  ?  ?  ?  ?  ?General transfer comment: assists himself ?  ? ?Ambulation/Gait ?Ambulation/Gait assistance: Min guard ?Gait Distance (Feet): 80 Feet ?Assistive device: Rolling walker (2 wheels) ?Gait Pattern/deviations: Step-through pattern ?Gait velocity: Decreased ?Gait velocity interpretation: <1.31 ft/sec, indicative of household ambulator ?  ?  ? ? ?Stairs ?  ?  ?  ?  ?  ? ? ?Wheelchair Mobility ?  ? ?Modified Rankin (Stroke Patients Only) ?  ? ? ?  ?Balance   ?Sitting-balance support: Feet supported ?Sitting balance-Leahy Scale: Good ?  ?  ?  ?Standing balance-Leahy Scale: Fair ?  ?  ?  ?  ?  ?  ?  ?  ?  ?  ?  ?  ?  ? ?  ?Cognition Arousal/Alertness: Awake/alert ?Behavior During Therapy: [facility] for tasks assessed/performed ?Overall Cognitive Status: No family/caregiver present to determine baseline cognitive functioning ?  ?  ?  ?  ?  ?  ?  ?  ?  ?  ?  ?  ?  ?  ?  ?  ?General Comments: generally good but conflicting info from the initial eval from OT ?  ?  ? ?  ?Exercises General Exercises - Upper Extremity ?Shoulder Flexion: AAROM, 5 reps ?Shoulder Extension: AROM, 10 reps ? ?  ?General Comments General comments (skin integrity, edema, etc.): instructed pt on repositioning on R shoulder but is noticing that the mm's are quite tight.  Has been complaining of the pain for awhile, encouraged him  to get outpatient therapy to work on ROM and tissue work to ease mm spasm ?  ?  ? ?Pertinent Vitals/Pain Pain Assessment ?Pain Assessment: Faces ?Faces Pain Scale: Hurts even more ?Pain Location: R shoulder (reports previous injury 6 weeks ago) ?Pain Descriptors / Indicators: Discomfort, Sore ?Pain Intervention(s): Limited activity within patient's tolerance, Premedicated before session, Repositioned, Heat applied  ? ? ?Home Living   ?  ?  ?  ?  ?  ?  ?  ?  ?  ?   ?  ?Prior Function    ?  ?  ?   ? ?PT Goals (current goals can  now be found in the care plan section) Acute Rehab PT Goals ?Patient Stated Goal: to stop falling ?Progress towards PT goals: Progressing toward goals ? ?  ?Frequency ? ? ? Min 3X/week ? ? ? ?  ?PT Plan Discharge plan needs to be updated  ? ? ?Co-evaluation   ?  ?  ?  ?  ? ?  ?AM-PAC PT "6 Clicks" Mobility   ?Outcome Measure ? Help needed turning from your back to your side while in a flat bed without using bedrails?: None ?Help needed moving from lying on your back to sitting on the side of a flat bed without using bedrails?: None ?Help needed moving to and from a bed to a chair (including a wheelchair)?: A Little ?Help needed standing up from a chair using your arms (e.g., wheelchair or bedside chair)?: A Little ?Help needed to walk in hospital room?: A Little ?Help needed climbing 3-5 steps with a railing? : A Little ?6 Click Score: 20 ? ?  ?End of Session Equipment Utilized During Treatment: Gait belt ?Activity Tolerance: Patient tolerated treatment well;Patient limited by pain ?Patient left: in bed;with call bell/phone within reach ?Nurse Communication: Mobility status ?PT Visit Diagnosis: Pain ?Pain - Right/Left: Right ?Pain - part of body: Shoulder ?  ? ? ?Time: 3762-8315 ?PT Time Calculation (min) (ACUTE ONLY): 21 min ? ?Charges:  $Therapeutic Exercise: 8-22 mins     ?Ramond Dial ?03/13/2022, 5:46 PM ? ?Mee Hives, PT PhD ?Acute Rehab Dept. Number: [facility] [phone] and [place] [phone] ? ? ?

## 2022-03-13 NOTE — Assessment & Plan Note (Signed)
Replenished prior to discharge. ?

## 2022-03-13 NOTE — TOC Initial Note (Signed)
Transition of Care (TOC) - Initial/Assessment Note  ? ? ?Patient Details  ?Name: Erik Turner ?MRN: [id number] ?Date of Birth: [birth year] ? ?Transition of Care (TOC) CM/SW Contact:    ?Marilu Favre, RN ?Phone Number: ?03/13/2022, 11:32 AM ? ?Clinical Narrative:                 ?Consult for patient requesting shelter list.  ? ?Patient's mailing address [place].  ? ?NCM spoke to patient at bedside, provided information on Assistance for homelessness for [facility]. Patient stated he had information for [facility] , there shelters are full, he would like [place] information.  ? ?NCM provided Clinic , shelter list, [employer], transportation, [place] information.  ? ?MD will send prescriptions to [facility]. Patient does have insurance. TOC Team will not be able to assist with co pays, Patient is aware and voiced understanding.  ? ?Ordered walker with Jasmin with [facility].  ? ? ?TOC Team  notes patient is homeless and unfortunately at this time there is significant barriers to securing housing in the medical setting. TOC Team  has provided patient with information on resources to follow-up with in the community once they discharge. TOC Team  notes patient arrived to the hospital with no current residence and at this time they are medically cleared they are appropriate to discharge back to their current living situation. ?   ? ?Expected Discharge Plan: [place] ?Barriers to Discharge: No Barriers Identified ? ? ?Patient Goals and CMS Choice ?  ?  ?  ? ?Expected Discharge Plan and Services ?Expected Discharge Plan: [place] ?In-house Referral: Financial Counselor ?Discharge Planning Services: CM Consult, [facility] ?  ?Living arrangements for the past 2 months: Homeless ?                ?DME Arranged: Walker rolling ?DME Agency: [employer] ?Date DME Agency Contacted: 03/13/22 ?Time DME Agency Contacted: 2500 ?Representative spoke with at DME Agency:  [place] ?HH Arranged: NA ?  ?  ?  ?  ? ?Prior Living Arrangements/Services ?Living arrangements for the past 2 months: Homeless ?Lives with:: Self ?Patient language and need for interpreter reviewed:: Yes ?Do you feel safe going back to the place where you live?: Yes      ?  ?  ?  ?Criminal Activity/Legal Involvement Pertinent to Current Situation/Hospitalization: No - Comment as needed ? ?Activities of Daily Living ?Home Assistive Devices/Equipment: [place] Rile (specify type) ?ADL Screening (condition at time of admission) ?Patient's cognitive ability adequate to safely complete daily activities?: Yes ?Is the patient deaf or have difficulty hearing?: No ?Does the patient have difficulty seeing, even when wearing glasses/contacts?: No ?Does the patient have difficulty concentrating, remembering, or making decisions?: No ?Patient able to express need for assistance with ADLs?: Yes ?Does the patient have difficulty dressing or bathing?: No ?Independently performs ADLs?: Yes (appropriate for developmental age) ?Does the patient have difficulty walking or climbing stairs?: No ?Weakness of Legs: None ?Weakness of Arms/Hands: None ? ?Permission Sought/Granted ?  ?Permission granted to share information with : No ?   ?   ?   ?   ? ?Emotional Assessment ?Appearance:: Appears stated age ?  ?  ?Orientation: : Oriented to Self, Oriented to Place, Oriented to  Time, Oriented to Situation ?  ?Psych Involvement: No (comment) ? ?Admission diagnosis:  AKI (acute kidney injury) ([place]) [N17.9] ?Altered mental status, unspecified altered mental status type [R41.82] ?AMS (altered mental status) [R41.82] ?Patient Active Problem List  ? Diagnosis  Date Noted  ? Recurrent falls 03/11/2022  ? Homeless 03/11/2022  ? Hypokalemia 03/11/2022  ? Acute metabolic encephalopathy 26/33/3545  ? Acute kidney injury superimposed on CKD 3a 03/10/2022  ? Essential hypertension 03/10/2022  ? Type 2 diabetes mellitus with chronic kidney disease, with long-term  current use of insulin ([place]) 03/10/2022  ? BPH (benign prostatic hyperplasia) 03/10/2022  ? Candidiasis of skin 03/10/2022  ? Malignant neoplasm of prostate ([place]) 04/26/2017  ? ?PCP:  [name], MD ?Pharmacy:   ?[place] [place] [address] ?[address] ?[place] [address] ?Phone: [phone] Fax: [phone] ? ?[facility] ?[address] ?[place] [address] ?Phone: [phone] Fax: [phone] ? ? ? ? ?Social Determinants of Health (SDOH) Interventions ?  ? ?Readmission Risk Interventions ?   ? View : No data to display.  ?  ?  ?  ? ? ? ?

## 2022-03-13 NOTE — TOC Transition Note (Signed)
Transition of Care (TOC) - CM/SW Discharge Note ? ? ?Patient Details  ?Name: DAHIR AYER ?MRN: [id number] ?Date of Birth: January 28, 1956 ? ?Transition of Care (TOC) CM/SW Contact:  ?Emeterio Reeve, LCSW ?Phone Number: ?03/13/2022, 4:03 PM ? ? ?Clinical Narrative:    ? ?CSW spoke to pt at bedside. CSW explained that pt has been given resources by [facility] and [place]. CSW has no other resources to offer pt. CSW gave pt a pair of pants and shirt from the clothing closet that he requested. Pt stated he did not need shoes or socks. CSW gave bus pass to nurse to give to pt when he leaves.  ? ?CSW spoke to [facility] supervisor about pt telling nurse he wasn't leaving. Supervisor called the [facility] who stated that if pt has not left within 30 minutes of receiving clothes then to have nurse give him a call. Nurse is aware.  ? ? ?Final next level of care: Other (comment) (Homeless/ Car) ?Barriers to Discharge: Barriers Resolved ? ? ?Patient Goals and CMS Choice ?  ?  ?  ? ?Discharge Placement ?  ?           ?  ?  ?  ?  ? ?Discharge Plan and Services ?In-house Referral: Financial Counselor ?Discharge Planning Services: CM Consult, [facility] ?           ?DME Arranged: Walker rolling ?DME Agency: [employer] ?Date DME Agency Contacted: 03/13/22 ?Time DME Agency Contacted: 2641 ?Representative spoke with at DME Agency: [place] ?HH Arranged: NA ?  ?  ?  ?  ? ?Social Determinants of Health (SDOH) Interventions ?  ? ? ?Readmission Risk Interventions ?   ? View : No data to display.  ?  ?  ?  ? ? ? ?Emeterio Reeve, LCSW ?Clinical Social Worker ? ? ?

## 2022-03-13 NOTE — Care Management Important Message (Signed)
Important Message ? ?Patient Details  ?Name: Erik Turner ?MRN: [id number] ?Date of Birth: 1956-03-12 ? ? ?Medicare Important Message Given:  Yes ? ? ? ? ?Levada Dy  Maykayla Highley-Martin ?03/13/2022, 2:55 PM ?

## 2022-03-13 NOTE — Progress Notes (Incomplete)
patient he is making DC difficult stating he has no where to go I have told him what CM told me that he was supposed to call different facilities he stated he has no phone and he told CM that he had a phone I am not sure but I am trying to DC him and it is something everytime ?

## 2022-03-14 ENCOUNTER — Telehealth: Payer: Self-pay

## 2022-03-14 LAB — VITAMIN B1: Vitamin B1 (Thiamine): 142.5 nmol/L (ref 66.5–200.0)

## 2022-03-14 NOTE — Telephone Encounter (Signed)
Transition Care Management Unsuccessful Follow-up Telephone Call ? ?Date of discharge and from where:  03/13/22 Erik Turner ? ?Attempts:  1st Attempt ? ?Reason for unsuccessful TCM follow-up call:  No answer/busy ? ? ? ?

## 2022-03-15 LAB — CULTURE, BLOOD (ROUTINE X 2): Culture: NO GROWTH

## 2022-03-15 NOTE — Telephone Encounter (Signed)
Transition Care Management Unsuccessful Follow-up Telephone Call ? ?Date of discharge and from where:  [facility]/13/22 [facility] ? ?Attempts:  2nd Attempt ? ?Reason for unsuccessful TCM follow-up call:  No answer/busy ? ? ? ?

## 2022-03-15 NOTE — Telephone Encounter (Signed)
Transition Care Management Unsuccessful Follow-up Telephone Call ? ?Date of discharge and from where:  03/13/22 Erik Turner ? ?Attempts:  3rd Attempt ? ?Reason for unsuccessful TCM follow-up call:  No answer/busy ? ? ? ?

## 2022-05-06 ENCOUNTER — Inpatient Hospital Stay (HOSPITAL_COMMUNITY)
Admission: EM | Admit: 2022-05-06 | Discharge: 2022-05-14 | DRG: 682 | Disposition: A | Discharge status: Skilled Nursing Facility | Payer: Medicare Other | Source: Skilled Nursing Facility | Attending: Internal Medicine | Admitting: Internal Medicine

## 2022-05-06 ENCOUNTER — Emergency Department (HOSPITAL_COMMUNITY): Payer: Medicare Other

## 2022-05-06 ENCOUNTER — Encounter (HOSPITAL_COMMUNITY): Payer: Self-pay

## 2022-05-06 ENCOUNTER — Inpatient Hospital Stay (HOSPITAL_COMMUNITY): Payer: Medicare Other

## 2022-05-06 DIAGNOSIS — F05 Delirium due to known physiological condition: Secondary | ICD-10-CM | POA: Diagnosis not present

## 2022-05-06 DIAGNOSIS — E1165 Type 2 diabetes mellitus with hyperglycemia: Secondary | ICD-10-CM | POA: Diagnosis present

## 2022-05-06 DIAGNOSIS — Z7984 Long term (current) use of oral hypoglycemic drugs: Secondary | ICD-10-CM | POA: Diagnosis not present

## 2022-05-06 DIAGNOSIS — D696 Thrombocytopenia, unspecified: Secondary | ICD-10-CM | POA: Diagnosis not present

## 2022-05-06 DIAGNOSIS — Z6838 Body mass index (BMI) 38.0-38.9, adult: Secondary | ICD-10-CM | POA: Diagnosis not present

## 2022-05-06 DIAGNOSIS — E870 Hyperosmolality and hypernatremia: Secondary | ICD-10-CM | POA: Diagnosis present

## 2022-05-06 DIAGNOSIS — N179 Acute kidney failure, unspecified: Secondary | ICD-10-CM | POA: Diagnosis not present

## 2022-05-06 DIAGNOSIS — R1310 Dysphagia, unspecified: Secondary | ICD-10-CM | POA: Diagnosis present

## 2022-05-06 DIAGNOSIS — N170 Acute kidney failure with tubular necrosis: Principal | ICD-10-CM | POA: Diagnosis present

## 2022-05-06 DIAGNOSIS — A419 Sepsis, unspecified organism: Secondary | ICD-10-CM | POA: Diagnosis not present

## 2022-05-06 DIAGNOSIS — E871 Hypo-osmolality and hyponatremia: Secondary | ICD-10-CM | POA: Diagnosis not present

## 2022-05-06 DIAGNOSIS — L89891 Pressure ulcer of other site, stage 1: Secondary | ICD-10-CM | POA: Diagnosis present

## 2022-05-06 DIAGNOSIS — R6521 Severe sepsis with septic shock: Secondary | ICD-10-CM | POA: Diagnosis not present

## 2022-05-06 DIAGNOSIS — R54 Age-related physical debility: Secondary | ICD-10-CM | POA: Diagnosis present

## 2022-05-06 DIAGNOSIS — I4891 Unspecified atrial fibrillation: Secondary | ICD-10-CM | POA: Diagnosis not present

## 2022-05-06 DIAGNOSIS — D72829 Elevated white blood cell count, unspecified: Secondary | ICD-10-CM | POA: Insufficient documentation

## 2022-05-06 DIAGNOSIS — I4819 Other persistent atrial fibrillation: Secondary | ICD-10-CM | POA: Diagnosis present

## 2022-05-06 DIAGNOSIS — E860 Dehydration: Secondary | ICD-10-CM | POA: Diagnosis present

## 2022-05-06 DIAGNOSIS — M8588 Other specified disorders of bone density and structure, other site: Secondary | ICD-10-CM | POA: Diagnosis present

## 2022-05-06 DIAGNOSIS — I10 Essential (primary) hypertension: Secondary | ICD-10-CM | POA: Diagnosis present

## 2022-05-06 DIAGNOSIS — E669 Obesity, unspecified: Secondary | ICD-10-CM | POA: Diagnosis present

## 2022-05-06 DIAGNOSIS — Z87891 Personal history of nicotine dependence: Secondary | ICD-10-CM

## 2022-05-06 DIAGNOSIS — L8990 Pressure ulcer of unspecified site, unspecified stage: Secondary | ICD-10-CM | POA: Insufficient documentation

## 2022-05-06 DIAGNOSIS — B372 Candidiasis of skin and nail: Secondary | ICD-10-CM | POA: Diagnosis present

## 2022-05-06 DIAGNOSIS — E875 Hyperkalemia: Secondary | ICD-10-CM | POA: Diagnosis present

## 2022-05-06 DIAGNOSIS — E872 Acidosis, unspecified: Secondary | ICD-10-CM | POA: Diagnosis present

## 2022-05-06 DIAGNOSIS — D6489 Other specified anemias: Secondary | ICD-10-CM | POA: Diagnosis present

## 2022-05-06 DIAGNOSIS — E785 Hyperlipidemia, unspecified: Secondary | ICD-10-CM | POA: Diagnosis present

## 2022-05-06 DIAGNOSIS — Z781 Physical restraint status: Secondary | ICD-10-CM

## 2022-05-06 DIAGNOSIS — U071 COVID-19: Secondary | ICD-10-CM | POA: Diagnosis not present

## 2022-05-06 DIAGNOSIS — G9341 Metabolic encephalopathy: Secondary | ICD-10-CM | POA: Diagnosis not present

## 2022-05-06 DIAGNOSIS — E119 Type 2 diabetes mellitus without complications: Secondary | ICD-10-CM

## 2022-05-06 DIAGNOSIS — L89311 Pressure ulcer of right buttock, stage 1: Secondary | ICD-10-CM | POA: Diagnosis present

## 2022-05-06 DIAGNOSIS — Z880 Allergy status to penicillin: Secondary | ICD-10-CM

## 2022-05-06 DIAGNOSIS — I959 Hypotension, unspecified: Secondary | ICD-10-CM | POA: Diagnosis not present

## 2022-05-06 DIAGNOSIS — Z9049 Acquired absence of other specified parts of digestive tract: Secondary | ICD-10-CM | POA: Diagnosis not present

## 2022-05-06 DIAGNOSIS — Z79899 Other long term (current) drug therapy: Secondary | ICD-10-CM

## 2022-05-06 LAB — COMPREHENSIVE METABOLIC PANEL
ALT: 20 U/L (ref 0–44)
AST: 19 U/L (ref 15–41)
Albumin: 3.5 g/dL (ref 3.5–5.0)
Alkaline Phosphatase: 68 U/L (ref 38–126)
BUN: 146 mg/dL — ABNORMAL HIGH (ref 8–23)
CO2: <7 mmol/L — ABNORMAL LOW (ref 22–32)
Calcium: 8.2 mg/dL — ABNORMAL LOW (ref 8.9–10.3)
Chloride: 99 mmol/L (ref 98–111)
Creatinine, Ser: 14.38 mg/dL — ABNORMAL HIGH (ref 0.61–1.24)
GFR, Estimated: 3 mL/min — ABNORMAL LOW (ref >60)
Glucose, Bld: 268 mg/dL — ABNORMAL HIGH (ref 70–99)
Potassium: >7.5 mmol/L (ref 3.5–5.1)
Sodium: 126 mmol/L — ABNORMAL LOW (ref 135–145)
Total Bilirubin: 0.6 mg/dL (ref 0.3–1.2)
Total Protein: 6.6 g/dL (ref 6.5–8.1)

## 2022-05-06 LAB — I-STAT VENOUS BLOOD GAS, ED
Acid-base deficit: 22 mmol/L — ABNORMAL HIGH (ref 0.0–2.0)
Bicarbonate: 5.9 mmol/L — ABNORMAL LOW (ref 20.0–28.0)
Calcium, Ion: 1.15 mmol/L (ref 1.15–1.40)
HCT: 38 % — ABNORMAL LOW (ref 39.0–52.0)
Hemoglobin: 12.9 g/dL — ABNORMAL LOW (ref 13.0–17.0)
O2 Saturation: 69 %
Potassium: 6.5 mmol/L (ref 3.5–5.1)
Sodium: 126 mmol/L — ABNORMAL LOW (ref 135–145)
TCO2: 6 mmol/L — ABNORMAL LOW (ref 22–32)
pCO2, Ven: 20 mmHg — ABNORMAL LOW (ref 44–60)
pH, Ven: 7.077 — CL (ref 7.25–7.43)
pO2, Ven: 49 mmHg — ABNORMAL HIGH (ref 32–45)

## 2022-05-06 LAB — URINALYSIS, ROUTINE W REFLEX MICROSCOPIC
Bilirubin Urine: NEGATIVE
Glucose, UA: NEGATIVE mg/dL
Hgb urine dipstick: NEGATIVE
Ketones, ur: NEGATIVE mg/dL
Leukocytes,Ua: NEGATIVE
Nitrite: NEGATIVE
Protein, ur: 30 mg/dL — AB
Specific Gravity, Urine: 1.018 (ref 1.005–1.030)
pH: 5 (ref 5.0–8.0)

## 2022-05-06 LAB — CBC WITH DIFFERENTIAL/PLATELET
Abs Immature Granulocytes: 0.33 10*3/uL — ABNORMAL HIGH (ref 0.00–0.07)
Basophils Absolute: 0.1 10*3/uL (ref 0.0–0.1)
Basophils Relative: 0 %
Eosinophils Absolute: 0 10*3/uL (ref 0.0–0.5)
Eosinophils Relative: 0 %
HCT: 39.1 % (ref 39.0–52.0)
Hemoglobin: 13.4 g/dL (ref 13.0–17.0)
Immature Granulocytes: 1 %
Lymphocytes Relative: 4 %
Lymphs Abs: 1.2 10*3/uL (ref 0.7–4.0)
MCH: 30.4 pg (ref 26.0–34.0)
MCHC: 34.3 g/dL (ref 30.0–36.0)
MCV: 88.7 fL (ref 80.0–100.0)
Monocytes Absolute: 2.2 10*3/uL — ABNORMAL HIGH (ref 0.1–1.0)
Monocytes Relative: 7 %
Neutro Abs: 27.9 10*3/uL — ABNORMAL HIGH (ref 1.7–7.7)
Neutrophils Relative %: 88 %
Platelets: 299 10*3/uL (ref 150–400)
RBC: 4.41 MIL/uL (ref 4.22–5.81)
RDW: 13.9 % (ref 11.5–15.5)
Smear Review: ADEQUATE
WBC: 31.6 10*3/uL — ABNORMAL HIGH (ref 4.0–10.5)
nRBC: 0 % (ref 0.0–0.2)

## 2022-05-06 LAB — CBC
HCT: 37.1 % — ABNORMAL LOW (ref 39.0–52.0)
Hemoglobin: 12.8 g/dL — ABNORMAL LOW (ref 13.0–17.0)
MCH: 30.5 pg (ref 26.0–34.0)
MCHC: 34.5 g/dL (ref 30.0–36.0)
MCV: 88.5 fL (ref 80.0–100.0)
Platelets: 260 10*3/uL (ref 150–400)
RBC: 4.19 MIL/uL — ABNORMAL LOW (ref 4.22–5.81)
RDW: 14 % (ref 11.5–15.5)
WBC: 33.5 10*3/uL — ABNORMAL HIGH (ref 4.0–10.5)
nRBC: 0 % (ref 0.0–0.2)

## 2022-05-06 LAB — MAGNESIUM: Magnesium: 1.8 mg/dL (ref 1.7–2.4)

## 2022-05-06 LAB — CREATININE, SERUM
Creatinine, Ser: 13.53 mg/dL — ABNORMAL HIGH (ref 0.61–1.24)
GFR, Estimated: 4 mL/min — ABNORMAL LOW (ref >60)

## 2022-05-06 LAB — I-STAT CHEM 8, ED
BUN: >130 mg/dL — ABNORMAL HIGH (ref 8–23)
Calcium, Ion: 1.06 mmol/L — ABNORMAL LOW (ref 1.15–1.40)
Chloride: 103 mmol/L (ref 98–111)
Creatinine, Ser: 15.6 mg/dL — ABNORMAL HIGH (ref 0.61–1.24)
Glucose, Bld: 231 mg/dL — ABNORMAL HIGH (ref 70–99)
HCT: 35 % — ABNORMAL LOW (ref 39.0–52.0)
Hemoglobin: 11.9 g/dL — ABNORMAL LOW (ref 13.0–17.0)
Potassium: 7.1 mmol/L (ref 3.5–5.1)
Sodium: 125 mmol/L — ABNORMAL LOW (ref 135–145)
TCO2: 8 mmol/L — ABNORMAL LOW (ref 22–32)

## 2022-05-06 LAB — TROPONIN I (HIGH SENSITIVITY)
Troponin I (High Sensitivity): 13 ng/L (ref <18)
Troponin I (High Sensitivity): 142 ng/L (ref <18)

## 2022-05-06 LAB — TSH: TSH: 1.88 u[IU]/mL (ref 0.350–4.500)

## 2022-05-06 LAB — RAPID URINE DRUG SCREEN, HOSP PERFORMED
Amphetamines: NOT DETECTED
Barbiturates: NOT DETECTED
Benzodiazepines: NOT DETECTED
Cocaine: NOT DETECTED
Opiates: NOT DETECTED
Tetrahydrocannabinol: NOT DETECTED

## 2022-05-06 LAB — PHOSPHORUS: Phosphorus: 9 mg/dL — ABNORMAL HIGH (ref 2.5–4.6)

## 2022-05-06 LAB — HEMOGLOBIN A1C
Hgb A1c MFr Bld: 6.6 % — ABNORMAL HIGH (ref 4.8–5.6)
Mean Plasma Glucose: 142.72 mg/dL

## 2022-05-06 LAB — TYPE AND SCREEN
ABO/RH(D): A NEG
Antibody Screen: NEGATIVE

## 2022-05-06 LAB — LACTIC ACID, PLASMA: Lactic Acid, Venous: 7.6 mmol/L (ref 0.5–1.9)

## 2022-05-06 LAB — ETHANOL: Alcohol, Ethyl (B): <10 mg/dL (ref <10)

## 2022-05-06 LAB — PROCALCITONIN: Procalcitonin: 0.65 ng/mL

## 2022-05-06 LAB — RESP PANEL BY RT-PCR (FLU A&B, COVID) ARPGX2
Influenza A by PCR: NEGATIVE
Influenza B by PCR: NEGATIVE
SARS Coronavirus 2 by RT PCR: POSITIVE — AB

## 2022-05-06 LAB — CK: Total CK: 116 U/L (ref 49–397)

## 2022-05-06 LAB — AMMONIA: Ammonia: 26 umol/L (ref 9–35)

## 2022-05-06 LAB — CBG MONITORING, ED: Glucose-Capillary: 211 mg/dL — ABNORMAL HIGH (ref 70–99)

## 2022-05-06 LAB — PROTIME-INR
INR: 1.4 — ABNORMAL HIGH (ref 0.8–1.2)
Prothrombin Time: 16.6 seconds — ABNORMAL HIGH (ref 11.4–15.2)

## 2022-05-06 LAB — SALICYLATE LEVEL: Salicylate Lvl: <7 mg/dL — ABNORMAL LOW (ref 7.0–30.0)

## 2022-05-06 LAB — ABO/RH: ABO/RH(D): A NEG

## 2022-05-06 LAB — ACETAMINOPHEN LEVEL: Acetaminophen (Tylenol), Serum: <10 ug/mL — ABNORMAL LOW (ref 10–30)

## 2022-05-06 MED ORDER — CALCIUM GLUCONATE-NACL 1-0.675 GM/50ML-% IV SOLN
1.0000 g | Freq: Once | INTRAVENOUS | Status: AC
  Administered 2022-05-06: 1000 mg via INTRAVENOUS
  Filled 2022-05-06: qty 50

## 2022-05-06 MED ORDER — INSULIN ASPART 100 UNIT/ML IJ SOLN
0.0000 [IU] | INTRAMUSCULAR | Status: DC
  Administered 2022-05-06: 2 [IU] via SUBCUTANEOUS
  Administered 2022-05-08 (×3): 1 [IU] via SUBCUTANEOUS

## 2022-05-06 MED ORDER — ACETAMINOPHEN 650 MG RE SUPP
650.0000 mg | Freq: Four times a day (QID) | RECTAL | Status: DC | PRN
Start: 2022-05-06 — End: 2022-05-14

## 2022-05-06 MED ORDER — SODIUM BICARBONATE 8.4 % IV SOLN
50.0000 meq | Freq: Once | INTRAVENOUS | Status: AC
  Administered 2022-05-06: 50 meq via INTRAVENOUS
  Filled 2022-05-06: qty 100

## 2022-05-06 MED ORDER — VANCOMYCIN HCL 1750 MG/350ML IV SOLN
1750.0000 mg | Freq: Once | INTRAVENOUS | Status: AC
  Administered 2022-05-06: 1750 mg via INTRAVENOUS
  Filled 2022-05-06: qty 350

## 2022-05-06 MED ORDER — CHLORHEXIDINE GLUCONATE CLOTH 2 % EX PADS
6.0000 | MEDICATED_PAD | Freq: Every day | CUTANEOUS | Status: DC
  Administered 2022-05-06 – 2022-05-13 (×8): 6 via TOPICAL

## 2022-05-06 MED ORDER — DEXTROSE 50 % IV SOLN
1.0000 | Freq: Once | INTRAVENOUS | Status: AC
  Administered 2022-05-06: 50 mL via INTRAVENOUS
  Filled 2022-05-06: qty 50

## 2022-05-06 MED ORDER — POLYETHYLENE GLYCOL 3350 17 G PO PACK: 17.0000 g | PACK | Freq: Every day | ORAL | Status: DC | PRN

## 2022-05-06 MED ORDER — LACTATED RINGERS IV BOLUS
1000.0000 mL | Freq: Once | INTRAVENOUS | Status: AC
  Administered 2022-05-06: 1000 mL via INTRAVENOUS

## 2022-05-06 MED ORDER — DOCUSATE SODIUM 100 MG PO CAPS
100.0000 mg | ORAL_CAPSULE | Freq: Two times a day (BID) | ORAL | Status: DC | PRN
  Administered 2022-05-12: 100 mg via ORAL
  Filled 2022-05-06: qty 1

## 2022-05-06 MED ORDER — VANCOMYCIN VARIABLE DOSE PER UNSTABLE RENAL FUNCTION (PHARMACIST DOSING)
Status: DC
Start: 2022-05-06 — End: 2022-05-08

## 2022-05-06 MED ORDER — SODIUM CHLORIDE 0.9 % IV SOLN
2.0000 g | Freq: Once | INTRAVENOUS | Status: AC
  Administered 2022-05-06: 2 g via INTRAVENOUS
  Filled 2022-05-06: qty 12.5

## 2022-05-06 MED ORDER — HEPARIN SODIUM (PORCINE) 5000 UNIT/ML IJ SOLN
5000.0000 [IU] | Freq: Three times a day (TID) | INTRAMUSCULAR | Status: DC
  Administered 2022-05-06 – 2022-05-14 (×23): 5000 [IU] via SUBCUTANEOUS
  Filled 2022-05-06 (×21): qty 1

## 2022-05-06 MED ORDER — METRONIDAZOLE 500 MG/100ML IV SOLN
500.0000 mg | Freq: Once | INTRAVENOUS | Status: AC
  Administered 2022-05-06: 500 mg via INTRAVENOUS
  Filled 2022-05-06: qty 100

## 2022-05-06 MED ORDER — NYSTATIN 100000 UNIT/GM EX CREA
TOPICAL_CREAM | Freq: Three times a day (TID) | CUTANEOUS | Status: DC
  Administered 2022-05-08: 1 via TOPICAL
  Filled 2022-05-06 (×2): qty 30

## 2022-05-06 MED ORDER — SODIUM ZIRCONIUM CYCLOSILICATE 10 G PO PACK: 10.0000 g | PACK | Freq: Once | ORAL | Status: DC

## 2022-05-06 MED ORDER — CEFEPIME HCL 1 G IJ SOLR
1.0000 g | INTRAMUSCULAR | Status: DC
  Administered 2022-05-07: 1 g via INTRAVENOUS
  Filled 2022-05-06 (×2): qty 10

## 2022-05-06 MED ORDER — INSULIN ASPART 100 UNIT/ML IV SOLN
5.0000 [IU] | Freq: Once | INTRAVENOUS | Status: AC
  Administered 2022-05-06: 5 [IU] via INTRAVENOUS

## 2022-05-06 NOTE — H&P (Signed)
NAME:  Erik Turner, MRN:  [id number], DOB:  19-Jun-1956, LOS: 0 ADMISSION DATE:  05/06/2022, CONSULTATION DATE:  5/20 REFERRING MD:  Dr. Francia Greaves, CHIEF COMPLAINT:  septic shock   History of Present Illness:  Patient is a 66 yo M w/ pertinent PMH of HTN, HLD, DMT2, surgical hx of colectomy with ostomy in place present to [facility] on 5/20 for acute encephalpathy.  Patient resides at [facility]. On 5/20 staff state he was found on floor less responsive. Unclear how long patient was on floor. Patient is alert to name, birth, and location but confused to year. Denies any pain. EMS called and patient noted to be hypotensive with SBP 70s. Given IV fluids in route and transported to [facility] ED. Upon arrival patient remains hypotensive and hypothermic 89 F. He is slow to respond but AO. WBC 31. Erythema noted in intertriginous area of groin without drainage. Patient given more IV fluids. Started on cefepime, vanc, and flagyl. Cultures sent. CXR and EKG no significant findings. Pertinent ED labs: Na 126, K 7.5, BUN 146, creat 14.38, Glucose 268, Hgb 13.4, trop 13, LA 7.6, ethanol WNL. PCCM consulted for icu admission.    Pertinent  Medical History   Past Medical History:  Diagnosis Date   Hypertension      Significant Hospital Events: Including procedures, antibiotic start and stop dates in addition to other pertinent events   5/20: admitted to [facility] septic shock  Interim History / Subjective:  Patient able to state name and place; not year; remains lethargic On room air MAP 80s  Objective   Blood pressure (!) 99/36, pulse 64, temperature (!) 89.9 F (32.2 C), temperature source Rectal, resp. rate (!) 32, SpO2 100 %.       No intake or output data in the 24 hours ending 05/06/22 2019 There were no vitals filed for this visit.  Examination: General:  ill appearing male w/ AMS HEENT: MM pink/moist Neuro: able to state name and place; not year; pupils 2 mm b/l PERRL CV: s1s2, no m/r/g PULM:   dim clear BS bilaterally; on room air GI: soft, bsx4 active; ostomy pouch without erythema or drainage Extremities: warm/dry, no edema  Skin: L intertriginous erythema present  CXR: no acute findings  Resolved Hospital Problem list     Assessment & Plan:   Septic Shock Hypothermia P: -will admit to ICU w/ continuous telemetry monitoring -continue cefepime, flagyl, vanc -check cultures: bcx2, urine culture/UA -check covid/flu -consider more IV fluids -trend troponin and lactic acid -trend wbc/fever curve -check PCT -continue bare hugger for normothermia  Acute Encephalopathy: ethanol wnl P: -CT head pending -check vbg -check ammonia and tsh -send UDS,  salicylate, acetaminophen level -limit sedating meds  Fall P: -f/u ct head, spine, abdomen, pelvis  AKI Hyperkalemia Lactic acidosis P: -calcium, insulin/dextrose, bicarb ordered -repeat bmp later tonight -foley placed -Trend BMP / urinary output -Replace electrolytes as indicated -Avoid nephrotoxic agents, ensure adequate renal perfusion  Hyponatremia P: -possibly hypovolemia related; receiving IV fluids -trend bmp -consider further work up  Hyperglycemia DMT2 P: -SSI and cbg monitoring -check a1c  Left intertriginous candidiasis P: -nystatin ordered  Hx of HTN/HLD P: -hold anti-hypertensive's -continue statin when able to take po  Best Practice (right click and "Reselect all SmartList Selections" daily)   Diet/type: NPO DVT prophylaxis: prophylactic heparin  GI prophylaxis: N/A Lines: N/A Foley:  Yes, and it is still needed Code Status:  full code Last date of multidisciplinary goals of care discussion [pending]  Labs   CBC: Recent Labs  Lab 05/06/22 1915 05/06/22 1955  WBC 31.6*  --   NEUTROABS 27.9*  --   HGB 13.4 11.9*  HCT 39.1 35.0*  MCV 88.7  --   PLT 299  --     Basic Metabolic Panel: Recent Labs  Lab 05/06/22 1915 05/06/22 1955  NA 126* 125*  K >7.5* 7.1*  CL  99 103  CO2 <7*  --   GLUCOSE 268* 231*  BUN 146* >130*  CREATININE 14.38* 15.60*  CALCIUM 8.2*  --    GFR: CrCl cannot be calculated (Unknown ideal weight.). Recent Labs  Lab 05/06/22 1915  WBC 31.6*  LATICACIDVEN 7.6*    Liver Function Tests: Recent Labs  Lab 05/06/22 1915  AST 19  ALT 20  ALKPHOS 68  BILITOT 0.6  PROT 6.6  ALBUMIN 3.5   No results for input(s): LIPASE, AMYLASE in the last 168 hours. No results for input(s): AMMONIA in the last 168 hours.  ABG    Component Value Date/Time   TCO2 8 (L) 05/06/2022 1955     Coagulation Profile: Recent Labs  Lab 05/06/22 1915  INR 1.4*    Cardiac Enzymes: Recent Labs  Lab 05/06/22 1915  CKTOTAL 116    HbA1C: Hgb A1c MFr Bld  Date/Time Value Ref Range Status  03/11/2022 01:08 AM 8.1 (H) 4.8 - 5.6 % Final    Comment:    (NOTE) Pre diabetes:          5.7%-6.4%  Diabetes:              >6.4%  Glycemic control for   <7.0% adults with diabetes     CBG: No results for input(s): GLUCAP in the last 168 hours.  Review of Systems:   Patient is encephalopathic and/or intubated. Therefore history has been obtained from chart review.    Past Medical History:  He,  has a past medical history of Hypertension.   Surgical History:   Past Surgical History:  Procedure Laterality Date   COLON SURGERY     Ileostomy 1976     Social History:   reports that he quit smoking about 49 years ago. His smoking use included cigarettes. He has quit using smokeless tobacco. He reports that he does not currently use alcohol after a past usage of about 1.0 standard drink per week. He reports that he does not use drugs.   Family History:  His family history is not on file.   Allergies Allergies  Allergen Reactions   Penicillins Itching    Patient states that all family members are allergic to penicillin & that he has been told by another physician to avoid penicillin.      Home Medications  Prior to Admission  medications   Medication Sig Start Date End Date Taking? Authorizing Provider  acetaminophen (TYLENOL) 325 MG tablet Take 2 tablets (650 mg total) by mouth every 6 (six) hours as needed for mild pain (or Fever >/= 101). 03/13/22   Gonfa, Taye T, MD  amLODipine (NORVASC) 10 MG tablet Take 1 tablet (10 mg total) by mouth daily. 03/13/22   Mercy Riding, MD  atorvastatin (LIPITOR) 20 MG tablet Take 1 tablet (20 mg total) by mouth daily. 03/13/22 09/09/22  Mercy Riding, MD  cholecalciferol (VITAMIN D) 1000 units tablet Take 1 tablet (1,000 Units total) by mouth daily. 03/13/22   Mercy Riding, MD  glipiZIDE (GLUCOTROL) 5 MG tablet Take 1 tablet (5 mg total) by  mouth 2 (two) times daily. 03/13/22 03/13/23  Mercy Riding, MD  losartan (COZAAR) 50 MG tablet Take 1 tablet (50 mg total) by mouth daily. 03/13/22   Mercy Riding, MD  metFORMIN (GLUCOPHAGE) 500 MG tablet Take 1 tablet (500 mg total) by mouth 2 (two) times daily with a meal. 03/13/22 09/09/22  Mercy Riding, MD  Multiple Vitamin (MULTIVITAMIN WITH MINERALS) TABS tablet Take 1 tablet by mouth daily. 03/13/22   Mercy Riding, MD     Critical care time: 45 minutes    JD Geryl Rankins [facility] 05/06/2022, 8:19 PM  Please see [email or web address] for pager details.  From 7A-7P if no response, please call [phone]. After hours, please call ELink [phone].

## 2022-05-06 NOTE — ED Notes (Signed)
Bair hugger applied.

## 2022-05-06 NOTE — Progress Notes (Signed)
eLink Physician-Brief Progress Note Patient Name: Erik Turner DOB: 1956-07-30 MRN: [id number]   Date of Service  05/06/2022  HPI/Events of Note  Patient admitted with altered mental status secondary to sepsis, acute kidney injury with uremia,  hyperkalemia, and likely multi-factorial shock, work up is in progress, he is on empiric broad spectrum antibiotics.  eICU Interventions  New Patient Evaluation.        Kerry Kass Emmylou Bieker 05/06/2022, 11:08 PM

## 2022-05-06 NOTE — ED Provider Notes (Signed)
[facility] EMERGENCY DEPARTMENT Provider Note   CSN: [id number] Arrival date & time: 05/06/22  1853     History  Chief Complaint  Patient presents with   Fall   Hypotension    Erik Turner is a 66 y.o. male.  66 year old male with prior medical history as detailed below presents for evaluation.  Patient arrives by EMS from [facility].  Staff at this facility found the patient on the floor beside his bed around 545.  It is unclear how long the patient was on the floor.  Patient is oriented to name, date of birth, location but is confused as to the year.  The patient cannot provide details as to how he ended up on the floor how long he was on the floor.  Patient denies any pain.  EMS noted hypotension.  Blood pressure was in the 44W systolic per EMS.  EMS administered 500 cc of fluids while in route to the ED.  The history is provided by the patient and medical records.  Illness Location:  Found down on floor at facility, unclear downtime.  Hypotension. Severity:  Severe Onset quality:  Unable to specify Timing:  Unable to specify Progression:  Unable to specify     Home Medications Prior to Admission medications   Medication Sig Start Date End Date Taking? Authorizing Provider  acetaminophen (TYLENOL) 325 MG tablet Take 2 tablets (650 mg total) by mouth every 6 (six) hours as needed for mild pain (or Fever >/= 101). 03/13/22   Gonfa, Taye T, MD  amLODipine (NORVASC) 10 MG tablet Take 1 tablet (10 mg total) by mouth daily. 03/13/22   Mercy Riding, MD  atorvastatin (LIPITOR) 20 MG tablet Take 1 tablet (20 mg total) by mouth daily. 03/13/22 09/09/22  Mercy Riding, MD  cholecalciferol (VITAMIN D) 1000 units tablet Take 1 tablet (1,000 Units total) by mouth daily. 03/13/22   Mercy Riding, MD  glipiZIDE (GLUCOTROL) 5 MG tablet Take 1 tablet (5 mg total) by mouth 2 (two) times daily. 03/13/22 03/13/23  Mercy Riding, MD  losartan (COZAAR) 50 MG tablet Take 1  tablet (50 mg total) by mouth daily. 03/13/22   Mercy Riding, MD  metFORMIN (GLUCOPHAGE) 500 MG tablet Take 1 tablet (500 mg total) by mouth 2 (two) times daily with a meal. 03/13/22 09/09/22  Mercy Riding, MD  Multiple Vitamin (MULTIVITAMIN WITH MINERALS) TABS tablet Take 1 tablet by mouth daily. 03/13/22   Mercy Riding, MD      Allergies    Penicillins    Review of Systems   Review of Systems  All other systems reviewed and are negative.  Physical Exam Updated Vital Signs BP (!) 75/50   Pulse 82   Resp (!) 36   SpO2 100%  Physical Exam Vitals and nursing note reviewed.  Constitutional:      General: He is not in acute distress.    Appearance: He is well-developed.     Comments: Alert, slow to respond to questioning, oriented to name, date of birth, and location  Appears ill  HENT:     Head: Normocephalic and atraumatic.  Eyes:     Conjunctiva/sclera: Conjunctivae normal.     Pupils: Pupils are equal, round, and reactive to light.  Cardiovascular:     Rate and Rhythm: Normal rate and regular rhythm.     Heart sounds: Normal heart sounds.  Pulmonary:     Effort: Pulmonary effort is normal. No  respiratory distress.     Breath sounds: Normal breath sounds.  Abdominal:     General: There is no distension.     Palpations: Abdomen is soft.     Tenderness: There is no abdominal tenderness.  Musculoskeletal:        General: No deformity. Normal range of motion.     Cervical back: Normal range of motion and neck supple.  Skin:    General: Skin is warm and dry.     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     Mental Status: He is alert and oriented to person, place, and time.    ED Results / Procedures / Treatments   Labs (all labs ordered are listed, but only abnormal results are displayed) Labs Reviewed  CULTURE,  BLOOD (ROUTINE X 2)  CULTURE, BLOOD (ROUTINE X 2)  RESP PANEL BY RT-PCR (FLU A&B, COVID) ARPGX2  CBC WITH DIFFERENTIAL/PLATELET  LACTIC ACID, PLASMA  LACTIC ACID, PLASMA  ETHANOL  COMPREHENSIVE METABOLIC PANEL  URINALYSIS, ROUTINE W REFLEX MICROSCOPIC  CK  PROTIME-INR  I-STAT CHEM 8, ED  TYPE AND SCREEN  TROPONIN I (HIGH SENSITIVITY)    EKG EKG Interpretation  Date/Time:  Saturday May 06 2022 18:58:51 EDT Ventricular Rate:  82 PR Interval:  206 QRS Duration: 106 QT Interval:  362 QTC Calculation: 423 R Axis:   83 Text Interpretation: Sinus rhythm Low voltage, precordial leads Consider right ventricular hypertrophy Abnormal inferior Q waves Confirmed by Dene Gentry [phone]) on 05/06/2022 7:09:35 PM  Radiology No results found.  Procedures Procedures    Medications Ordered in ED Medications  metroNIDAZOLE (FLAGYL) IVPB 500 mg (has no administration in time range)  vancomycin (VANCOREADY) IVPB 1750 mg/350 mL (has no administration in time range)  ceFEPIme (MAXIPIME) 2 g in sodium chloride 0.9 % 100 mL IVPB (has no administration in time range)  lactated ringers bolus 1,000 mL (has no administration in time range)  lactated ringers bolus 1,000 mL (1,000 mLs Intravenous New Bag/Given 05/06/22 1916)  lactated ringers bolus 1,000 mL (0 mLs Intravenous Stopped 05/06/22 1916)    ED Course/ Medical Decision Making/ A&P                           Medical Decision Making Amount and/or Complexity of Data Reviewed Labs: ordered. Radiology: ordered.  Risk Prescription drug management.    Medical Screen Complete  This patient presented to the ED with complaint of found down, hypotension.  This complaint involves an extensive number of treatment options. The initial differential diagnosis includes, but is not limited to, sepsis, renal failure, metabolic abnormality, bacterial infection, etc.  This presentation is: Acute, Self-Limited, Previously Undiagnosed, Uncertain  Prognosis, Complicated, Systemic Symptoms, and Threat to Life/Bodily Function  Patient found down at his facility.  Patient apparently was on the floor next to his bed.  Unclear as to the amount of time that the patient was on the floor.  Patient cannot report any significant details of the events leading to his being on the floor.  EMS reports that the patient was hypotensive and hypothermic upon their initial evaluation.  Patient initiated with bear hugger and IV fluids.  Blood pressure does appear to be fluid responsive.  Broad-spectrum antibiotics initiated for possible sepsis.  Initial labs are concerning for elevated BUN and  creatinine suggesting significant renal failure.  Significant leukocytosis noted  Potassium is elevated to greater than 7.  Treatment for hyperkalemia initiated.  Foley catheter placed.  Critical care made aware of case and will evaluate for admission.  CT imaging pending at time of admission.      Co morbidities that complicated the patient's evaluation  History of hypertension, history of hyperlipidemia, diabetes, status post colectomy with ostomy.   Additional history obtained:  Additional history obtained from EMS External records from outside sources obtained and reviewed including prior ED visits and prior Inpatient records.    Lab Tests:  I ordered and personally interpreted labs.  The pertinent results include: CBC, CMP, lactic, INR, I stat Chem-8, troponin, CK   Imaging Studies ordered:  I ordered imaging studies including chest x-ray, CT head, CT C-spine, CT abdomen pelvis I independently visualized and interpreted the chest x-ray imaging which showed NAD I agree with the radiologist interpretation.   Cardiac Monitoring:  The patient was maintained on a cardiac monitor.  I personally viewed and interpreted the cardiac monitor which showed an underlying rhythm of: NSR   Medicines ordered:  I ordered medication including  broad-spectrum antibiotics, IV fluids, treatment for hyperkalemia for hypotension, infection, hyperkalemia Reevaluation of the patient after these medicines showed that the patient: improved    Critical Interventions:  Initiation of IV fluids, initiation of antibiotics, initiation of treatment for hyperkalemia   Consultations Obtained:  I consulted critical care,  and discussed lab and imaging findings as well as pertinent plan of care.    Problem List / ED Course:  Hypotension, altered mental status, leukocytosis, renal failure, hyperkalemia   Reevaluation:  After the interventions noted above, I reevaluated the patient and found that they have: improved   Disposition:  After consideration of the diagnostic results and the patients response to treatment, I feel that the patent would benefit from admission.   CRITICAL CARE Performed by: Valarie Merino   Total critical care time: 45 minutes  Critical care time was exclusive of separately billable procedures and treating other patients.  Critical care was necessary to treat or prevent imminent or life-threatening deterioration.  Critical care was time spent personally by me on the following activities: development of treatment plan with patient and/or surrogate as well as nursing, discussions with consultants, evaluation of patient's response to treatment, examination of patient, obtaining history from patient or surrogate, ordering and performing treatments and interventions, ordering and review of laboratory studies, ordering and review of radiographic studies, pulse oximetry and re-evaluation of patient's condition.           Final Clinical Impression(s) / ED Diagnoses Final diagnoses:  Hypotension, unspecified hypotension type  Leukocytosis, unspecified type  AKI (acute kidney injury) ([place])  Hyperkalemia    Rx / DC Orders ED Discharge Orders     None         Valarie Merino, MD 05/06/22 2055

## 2022-05-06 NOTE — Progress Notes (Signed)
Pt being followed by ELink for Sepsis protocol. 

## 2022-05-06 NOTE — ED Triage Notes (Signed)
Pt arrives via EMS from [facility]. Staff at facility found the patient lying on the floor beside his bed around 1745. Unsure when patient was last seen prior to being found on the floor. Pt is oriented to name, dob, and location, confused on the year. No blood thinners reported patient is hypotensive during triage.

## 2022-05-06 NOTE — Progress Notes (Signed)
Pharmacy Antibiotic Note  Erik Turner is a 67 y.o. male for which pharmacy has been consulted for cefepime and vancomycin dosing for sepsis.  Patient with a history of HTN, HLD, DMT2, surgical hx of colectomy with ostomy in place. Patient presenting with septic shock / acute encephalopathy.  SCr 15.6 (0.84 in March '23) WBC 31.6; LA 7.6; T 93.6 F; HR 71; RR 33  Plan: Metronidazole per MD Cefepime 2g given in ED >> will switch to 1g q24hr Vancomycin 1750 mg once, subsequent dosing as indicated per random vancomycin level until renal function stable and/or improved, at which time scheduled dosing can be considered Trend WBC, Fever, Renal function, & Clinical course F/u cultures, clinical course, WBC, fever De-escalate when able F/u Nephrology plan & adjust dosing if RRT started as indicated   No data recorded.  No results for input(s): WBC, CREATININE, LATICACIDVEN, VANCOTROUGH, VANCOPEAK, VANCORANDOM, GENTTROUGH, GENTPEAK, GENTRANDOM, TOBRATROUGH, TOBRAPEAK, TOBRARND, AMIKACINPEAK, AMIKACINTROU, AMIKACIN in the last 168 hours.  CrCl cannot be calculated (Patient's most recent lab result is older than the maximum 21 days allowed.).    Allergies  Allergen Reactions   Penicillins Itching    Patient states that all family members are allergic to penicillin & that he has been told by another physician to avoid penicillin.     Antimicrobials this admission: metronidazole 5/20 >>  cefepime 5/20 >>  vancomycin 5/20 >>   Microbiology results: Pending  Thank you for allowing pharmacy to be a part of this patient's care.  Lorelei Pont, PharmD, BCPS 05/06/2022 7:12 PM ED Clinical Pharmacist -  [phone]

## 2022-05-07 ENCOUNTER — Inpatient Hospital Stay (HOSPITAL_COMMUNITY): Payer: Medicare Other

## 2022-05-07 DIAGNOSIS — A419 Sepsis, unspecified organism: Secondary | ICD-10-CM | POA: Diagnosis not present

## 2022-05-07 DIAGNOSIS — R6521 Severe sepsis with septic shock: Secondary | ICD-10-CM | POA: Diagnosis not present

## 2022-05-07 DIAGNOSIS — L8990 Pressure ulcer of unspecified site, unspecified stage: Secondary | ICD-10-CM | POA: Insufficient documentation

## 2022-05-07 LAB — BASIC METABOLIC PANEL
Anion gap: 20 — ABNORMAL HIGH (ref 5–15)
Anion gap: 22 — ABNORMAL HIGH (ref 5–15)
BUN: 141 mg/dL — ABNORMAL HIGH (ref 8–23)
BUN: 143 mg/dL — ABNORMAL HIGH (ref 8–23)
CO2: 8 mmol/L — ABNORMAL LOW (ref 22–32)
CO2: 11 mmol/L — ABNORMAL LOW (ref 22–32)
Calcium: 8.5 mg/dL — ABNORMAL LOW (ref 8.9–10.3)
Calcium: 8.3 mg/dL — ABNORMAL LOW (ref 8.9–10.3)
Chloride: 100 mmol/L (ref 98–111)
Chloride: 101 mmol/L (ref 98–111)
Creatinine, Ser: 13.59 mg/dL — ABNORMAL HIGH (ref 0.61–1.24)
Creatinine, Ser: 12.93 mg/dL — ABNORMAL HIGH (ref 0.61–1.24)
GFR, Estimated: 4 mL/min — ABNORMAL LOW (ref >60)
GFR, Estimated: 4 mL/min — ABNORMAL LOW (ref >60)
Glucose, Bld: 179 mg/dL — ABNORMAL HIGH (ref 70–99)
Glucose, Bld: 130 mg/dL — ABNORMAL HIGH (ref 70–99)
Potassium: 6.7 mmol/L (ref 3.5–5.1)
Potassium: 5.1 mmol/L (ref 3.5–5.1)
Sodium: 128 mmol/L — ABNORMAL LOW (ref 135–145)
Sodium: 134 mmol/L — ABNORMAL LOW (ref 135–145)

## 2022-05-07 LAB — POCT I-STAT 7, (LYTES, BLD GAS, ICA,H+H)
Acid-base deficit: 16 mmol/L — ABNORMAL HIGH (ref 0.0–2.0)
Bicarbonate: 8.7 mmol/L — ABNORMAL LOW (ref 20.0–28.0)
Calcium, Ion: 1.14 mmol/L — ABNORMAL LOW (ref 1.15–1.40)
HCT: 33 % — ABNORMAL LOW (ref 39.0–52.0)
Hemoglobin: 11.2 g/dL — ABNORMAL LOW (ref 13.0–17.0)
O2 Saturation: 98 %
Patient temperature: 36.6
Potassium: 5.7 mmol/L — ABNORMAL HIGH (ref 3.5–5.1)
Sodium: 129 mmol/L — ABNORMAL LOW (ref 135–145)
TCO2: 9 mmol/L — ABNORMAL LOW (ref 22–32)
pCO2 arterial: 18 mmHg — CL (ref 32–48)
pH, Arterial: 7.293 — ABNORMAL LOW (ref 7.35–7.45)
pO2, Arterial: 102 mmHg (ref 83–108)

## 2022-05-07 LAB — URINALYSIS, ROUTINE W REFLEX MICROSCOPIC
Bilirubin Urine: NEGATIVE
Glucose, UA: NEGATIVE mg/dL
Ketones, ur: NEGATIVE mg/dL
Nitrite: NEGATIVE
Protein, ur: 100 mg/dL — AB
RBC / HPF: >50 RBC/hpf — ABNORMAL HIGH (ref 0–5)
Specific Gravity, Urine: 1.018 (ref 1.005–1.030)
WBC, UA: >50 WBC/hpf — ABNORMAL HIGH (ref 0–5)
pH: 5 (ref 5.0–8.0)

## 2022-05-07 LAB — GLUCOSE, CAPILLARY
Glucose-Capillary: 138 mg/dL — ABNORMAL HIGH (ref 70–99)
Glucose-Capillary: 180 mg/dL — ABNORMAL HIGH (ref 70–99)
Glucose-Capillary: 205 mg/dL — ABNORMAL HIGH (ref 70–99)
Glucose-Capillary: 130 mg/dL — ABNORMAL HIGH (ref 70–99)
Glucose-Capillary: 117 mg/dL — ABNORMAL HIGH (ref 70–99)
Glucose-Capillary: 145 mg/dL — ABNORMAL HIGH (ref 70–99)
Glucose-Capillary: 138 mg/dL — ABNORMAL HIGH (ref 70–99)
Glucose-Capillary: 125 mg/dL — ABNORMAL HIGH (ref 70–99)

## 2022-05-07 LAB — BLOOD GAS, VENOUS
Acid-base deficit: 9.6 mmol/L — ABNORMAL HIGH (ref 0.0–2.0)
Bicarbonate: 14.6 mmol/L — ABNORMAL LOW (ref 20.0–28.0)
Drawn by: 3409
O2 Saturation: 81.9 %
Patient temperature: 37
pCO2, Ven: 27 mmHg — ABNORMAL LOW (ref 44–60)
pH, Ven: 7.34 (ref 7.25–7.43)
pO2, Ven: 50 mmHg — ABNORMAL HIGH (ref 32–45)

## 2022-05-07 LAB — POTASSIUM: Potassium: 4.7 mmol/L (ref 3.5–5.1)

## 2022-05-07 LAB — CBC
HCT: 32.8 % — ABNORMAL LOW (ref 39.0–52.0)
Hemoglobin: 12 g/dL — ABNORMAL LOW (ref 13.0–17.0)
MCH: 30.6 pg (ref 26.0–34.0)
MCHC: 36.6 g/dL — ABNORMAL HIGH (ref 30.0–36.0)
MCV: 83.7 fL (ref 80.0–100.0)
Platelets: 186 10*3/uL (ref 150–400)
RBC: 3.92 MIL/uL — ABNORMAL LOW (ref 4.22–5.81)
RDW: 13.8 % (ref 11.5–15.5)
WBC: 22.3 10*3/uL — ABNORMAL HIGH (ref 4.0–10.5)
nRBC: 0 % (ref 0.0–0.2)

## 2022-05-07 LAB — MRSA NEXT GEN BY PCR, NASAL: MRSA by PCR Next Gen: NOT DETECTED

## 2022-05-07 LAB — BETA-HYDROXYBUTYRIC ACID: Beta-Hydroxybutyric Acid: 0.44 mmol/L — ABNORMAL HIGH (ref 0.05–0.27)

## 2022-05-07 LAB — LACTIC ACID, PLASMA
Lactic Acid, Venous: 3.5 mmol/L (ref 0.5–1.9)
Lactic Acid, Venous: 4.9 mmol/L (ref 0.5–1.9)

## 2022-05-07 LAB — PROCALCITONIN: Procalcitonin: 0.8 ng/mL

## 2022-05-07 MED ORDER — SODIUM ZIRCONIUM CYCLOSILICATE 10 G PO PACK
10.0000 g | PACK | Freq: Three times a day (TID) | ORAL | Status: DC
  Administered 2022-05-07 (×4): 10 g
  Filled 2022-05-07 (×3): qty 1

## 2022-05-07 MED ORDER — INSULIN ASPART 100 UNIT/ML IV SOLN
5.0000 [IU] | Freq: Once | INTRAVENOUS | Status: AC
  Administered 2022-05-07: 5 [IU] via INTRAVENOUS

## 2022-05-07 MED ORDER — SODIUM BICARBONATE 8.4 % IV SOLN
INTRAVENOUS | Status: AC
  Filled 2022-05-07: qty 50

## 2022-05-07 MED ORDER — STERILE WATER FOR INJECTION IV SOLN
INTRAVENOUS | Status: AC
  Filled 2022-05-07 (×4): qty 1000

## 2022-05-07 MED ORDER — SODIUM CHLORIDE 0.9 % IV BOLUS
1000.0000 mL | Freq: Once | INTRAVENOUS | Status: AC
  Administered 2022-05-07: 1000 mL via INTRAVENOUS

## 2022-05-07 MED ORDER — SODIUM BICARBONATE 8.4 % IV SOLN
INTRAVENOUS | Status: AC
  Administered 2022-05-07: 100 meq via INTRAVENOUS
  Filled 2022-05-07: qty 100

## 2022-05-07 MED ORDER — AMIODARONE IV BOLUS ONLY 150 MG/100ML: 150.0000 mg | Freq: Once | INTRAVENOUS | Status: AC

## 2022-05-07 MED ORDER — AMIODARONE IV BOLUS ONLY 150 MG/100ML
INTRAVENOUS | Status: AC
  Administered 2022-05-07: 150 mg via INTRAVENOUS
  Filled 2022-05-07: qty 100

## 2022-05-07 MED ORDER — CHLORHEXIDINE GLUCONATE 0.12 % MT SOLN
15.0000 mL | Freq: Two times a day (BID) | OROMUCOSAL | Status: DC
  Administered 2022-05-07 – 2022-05-13 (×13): 15 mL via OROMUCOSAL
  Filled 2022-05-07 (×8): qty 15

## 2022-05-07 MED ORDER — DEXTROSE 50 % IV SOLN
1.0000 | Freq: Once | INTRAVENOUS | Status: AC
  Administered 2022-05-07: 50 mL via INTRAVENOUS
  Filled 2022-05-07: qty 50

## 2022-05-07 MED ORDER — PHENYLEPHRINE HCL-NACL 20-0.9 MG/250ML-% IV SOLN: 25.0000 ug/min | INTRAVENOUS | Status: DC

## 2022-05-07 MED ORDER — HEPARIN SODIUM (PORCINE) 1000 UNIT/ML IJ SOLN
INTRAMUSCULAR | Status: AC
  Filled 2022-05-07: qty 4

## 2022-05-07 MED ORDER — SODIUM BICARBONATE 8.4 % IV SOLN: 100.0000 meq | Freq: Once | INTRAVENOUS | Status: DC

## 2022-05-07 MED ORDER — SODIUM CHLORIDE 0.9 % IV SOLN: 250.0000 mL | INTRAVENOUS | Status: DC

## 2022-05-07 MED ORDER — ORAL CARE MOUTH RINSE
15.0000 mL | Freq: Two times a day (BID) | OROMUCOSAL | Status: DC
  Administered 2022-05-07 – 2022-05-13 (×14): 15 mL via OROMUCOSAL

## 2022-05-07 MED ORDER — SODIUM BICARBONATE 8.4 % IV SOLN: 100.0000 meq | Freq: Once | INTRAVENOUS | Status: AC

## 2022-05-07 MED ORDER — SODIUM ZIRCONIUM CYCLOSILICATE 10 G PO PACK
10.0000 g | PACK | Freq: Three times a day (TID) | ORAL | Status: DC
  Filled 2022-05-07: qty 1

## 2022-05-07 MED ORDER — IPRATROPIUM-ALBUTEROL 0.5-2.5 (3) MG/3ML IN SOLN
3.0000 mL | RESPIRATORY_TRACT | Status: DC | PRN
  Administered 2022-05-07: 3 mL via RESPIRATORY_TRACT
  Filled 2022-05-07: qty 3

## 2022-05-07 NOTE — Progress Notes (Addendum)
eLink Physician-Brief Progress Note Patient Name: Erik Turner DOB: Sep 28, 1956 MRN: [id number]   Date of Service  05/07/2022  HPI/Events of Note  Patient with profound metabolic acidosis and hyperkalemia in the context of acute renal failure. Patient has been very agitated and trying to climb out of bed.  eICU Interventions  Sodium bicarbonate 100 meq iv bolus followed by Bicarb gtt at 75 ml / hour, Nephrology paged stat-I spoke with the nephrologist on call who is coming in to see patient regarding emergent dialysis. Posey restraints ordered. ABG after Bicarb pushes. Hyperkalemia treatment protocol ordered.        Erik Turner 05/07/2022, 12:22 AM

## 2022-05-07 NOTE — Progress Notes (Signed)
Pt arrived to 24m4 from ED. Vacomycin and LR bolus currently infusing in left upper arm. [facility]. Left AC is painful to touch, weeping and blistered. Previous RN unsure of what caused reaction. Pharmacy has been notified. No new orders at this time. IV team at bedside with this RN. IV team recommends ice. Ice pack applied. Will continue to monitor.

## 2022-05-07 NOTE — Consult Note (Addendum)
[facility] Nephrology Consultation Note  Requesting MD: Dr. Trevor Mace Reason for consult: AKI, hyperkalemia  HPI:  Erik Turner is a 66 y.o. male with past medical history of hypertension, hyperlipidemia, DM, colectomy with ostomy in place presented after found down on the floor, seen as a consultation for AKI and hyperkalemia.  The patient lives at [facility].  The staff there found him on the floor less responsive.  It is unclear how long he was on the floor however he was found to be very confused.  The EMS noted hypotension with systolic blood pressure in 70s.  Give IV fluid and brought to the ER.  In the ER he was hypotensive to 70s, hypothermic to 89.  The lab showed potassium of 7.5, BUN 146, creatinine level around 15.6, lactic acid 7.6, severe acidotic, WBC 33.5.  He received 3 L of fluid resuscitation and started on broad-spectrum antibiotics with cefepime and vancomycin for septic shock.  It seems like he is responding with IV fluid.  CT scan of abdomen pelvis with no trauma or source of sepsis.  CT head with no acute finding however has atrophy and chronic small vessel ischemia. For hyperkalemia with the patient was treated with sodium bicarbonate, IV fluid, Lokelma, insulin, dextrose.  The potassium level gradually improved to 5.7 in ABG. The patient is currently alert awake and oriented to hospital and his name.  He is not able to provide detailed review of system because of his confusion.  The home medications include amlodipine, glipizide, losartan, metformin.  PMHx:   Past Medical History:  Diagnosis Date   Hypertension     Past Surgical History:  Procedure Laterality Date   COLON SURGERY     Ileostomy 1976    Family Hx: History reviewed. No pertinent family history.  Social History:  reports that he quit smoking about 49 years ago. His smoking use included cigarettes. He has quit using smokeless tobacco. He reports that he does not currently  use alcohol after a past usage of about 1.0 standard drink per week. He reports that he does not use drugs.  Allergies:  Allergies  Allergen Reactions   Penicillins Itching    Patient states that all family members are allergic to penicillin & that he has been told by another physician to avoid penicillin.     Medications: Prior to Admission medications   Medication Sig Start Date End Date Taking? Authorizing Provider  acetaminophen (TYLENOL) 325 MG tablet Take 2 tablets (650 mg total) by mouth every 6 (six) hours as needed for mild pain (or Fever >/= 101). 03/13/22  Yes Gonfa, Taye T, MD  amLODipine (NORVASC) 10 MG tablet Take 1 tablet (10 mg total) by mouth daily. 03/13/22  Yes Mercy Riding, MD  Capsaicin-Menthol (SALONPAS GEL EX) Apply 1 application. topically daily. Right shoulder   Yes [provider]  cholecalciferol (VITAMIN D) 1000 units tablet Take 1 tablet (1,000 Units total) by mouth daily. 03/13/22  Yes Mercy Riding, MD  dutasteride (AVODART) 0.5 MG capsule Take 0.5 mg by mouth daily.   Yes [provider]  glipiZIDE (GLUCOTROL) 5 MG tablet Take 1 tablet (5 mg total) by mouth 2 (two) times daily. 03/13/22 03/13/23 Yes Mercy Riding, MD  losartan (COZAAR) 100 MG tablet Take 100 mg by mouth daily.   Yes [provider]  metFORMIN (GLUCOPHAGE) 500 MG tablet Take 1 tablet (500 mg total) by mouth 2 (two) times daily with a meal. Patient taking  differently: Take 1,000 mg by mouth daily with breakfast. 03/13/22 09/09/22 Yes Mercy Riding, MD  atorvastatin (LIPITOR) 20 MG tablet Take 1 tablet (20 mg total) by mouth daily. Patient not taking: Reported on 05/06/2022 03/13/22 09/09/22  Mercy Riding, MD  losartan (COZAAR) 50 MG tablet Take 1 tablet (50 mg total) by mouth daily. Patient not taking: Reported on 05/06/2022 03/13/22   Mercy Riding, MD  Multiple Vitamin (MULTIVITAMIN WITH MINERALS) TABS tablet Take 1 tablet by mouth daily. Patient not taking: Reported on  05/06/2022 03/13/22   Mercy Riding, MD    I have reviewed the patient's current medications.  Labs: Renal Panel: Recent Labs    03/06/22 1544 03/10/22 1626 03/11/22 0108 03/11/22 1046 03/12/22 0225 03/13/22 0834 05/06/22 1915 05/06/22 1955 05/06/22 2130 05/06/22 2221 05/07/22 0018 05/07/22 0109  NA 138 136 136  --  137 139 126* 125*  --  126* 128* 129*  K 3.4* 4.2 2.7* 3.6 3.1* 3.6 >7.5* 7.1*  --  6.5* 6.7* 5.7*  CL 100 98 100  --  102 102 99 103  --   --  100  --   CO2 '25 27 26  '$ --  24 30 <7*  --   --   --  8*  --   GLUCOSE 192* 228* 204*  --  205* 163* 268* 231*  --   --  179*  --   BUN 26* 24* 20  --  14 9 146* >130*  --   --  141*  --   CREATININE 1.46* 2.11* 1.41*  --  0.98 0.84 14.38* 15.60* 13.53*  --  13.59*  --   CALCIUM 9.0 9.2 8.4*  --  8.6* 8.6* 8.2*  --   --   --  8.5*  --   MG  --   --  1.8  --   --  1.4*  --   --  1.8  --   --   --   PHOS  --   --   --   --   --  2.6  --   --  9.0*  --   --   --   ALBUMIN 4.0 4.0  --   --   --  3.2* 3.5  --   --   --   --   --      CBC:    Latest Ref Rng & Units 05/07/2022    1:09 AM 05/06/2022   10:21 PM 05/06/2022    9:30 PM  CBC  WBC 4.0 - 10.5 K/uL   33.5    Hemoglobin 13.0 - 17.0 g/dL 11.2   12.9   12.8    Hematocrit 39.0 - 52.0 % 33.0   38.0   37.1    Platelets 150 - 400 K/uL   260       Anemia Panel:  Recent Labs    05/06/22 1915 05/06/22 1955 05/06/22 2130 05/06/22 2221 05/07/22 0109  HGB 13.4 11.9* 12.8* 12.9* 11.2*  MCV 88.7  --  88.5  --   --     Recent Labs  Lab 05/06/22 1915  AST 19  ALT 20  ALKPHOS 68  BILITOT 0.6  PROT 6.6  ALBUMIN 3.5    Lab Results  Component Value Date   HGBA1C 6.6 (H) 05/06/2022    ROS: As per H&P.  Rest of the review of system are limited because of his confusion.  Physical Exam: Vitals:  05/07/22 0027 05/07/22 0100  BP:  (!) 88/31  Pulse: 96 99  Resp: (!) 22 (!) 25  Temp: (!) 97.5 F (36.4 C)   SpO2: 100% 100%     General exam: Confused male lying  on bed, not in distress, dry oral mucous membrane. Respiratory system: Clear to auscultation. Respiratory effort normal. No wheezing or crackle Cardiovascular system: S1 & S2 heard, RRR.  No pedal edema. Gastrointestinal system: Abdomen is nondistended, soft and nontender. Normal bowel sounds heard. Central nervous system:  alert awake and oriented to his name, hospital. Extremities: No edema, no cyanosis Skin: No rashes, lesions or ulcers Psychiatry: Judgement and insight appear impaired   Assessment/Plan: #Acute kidney injury in the setting of septic shock, renal hypoperfusion: Patient with severe hyperkalemia, hyponatremia and lactic acidosis.  Received fluid resuscitation in ER.  CT scan without hydronephrosis.  I will check UA.  Since potassium level is improving with medical management, I will increase IV fluid rate and repeat lab in the morning.  If no improvement in electrolytes or renal function, then he may need CRRT.  I have discussed with ICU team.  Strict ins and outs and close monitoring of the lab.  #Severe hyperkalemia in the setting of AKI, losartan use.  Received medical treatment including calcium, insulin, dextrose, bicarbonate and Lokelma.  The potassium level improved to 5.7 in recent lab.  I will continue medical treatment and monitor lab.  #Septic shock: Source unknown.  Currently on broad-spectrum antibiotics with vancomycin and cefepime.  CT scan did not show source of infection.  The repeat blood pressure around 110s without pressors.  I will order another bolus of normal saline.  #Hyponatremia due to dehydration: Sodium level improving with IV fluid.  Monitor lab.  #Severe metabolic and lactic acidosis: Due to shock and AKI.  Lactic acid level improving with current management.  Increase sodium bicarbonate.  I have discussed with the ICU nurses and the ICU providers.  Thank you for the consult.  We will follow with you.   Jaxsyn Azam Tanna Furry 05/07/2022, 1:23 AM   [employer].

## 2022-05-07 NOTE — Progress Notes (Signed)
eLink Physician-Brief Progress Note Patient Name: Erik Turner DOB: [birth year] MRN: [id number]   Date of Service  05/07/2022  HPI/Events of Note  BP 78/30, HR 109, patient just completed a liter of NS iv fluid bolus. He only has PIV's.  eICU Interventions  Phenylephrine gtt ordered via PIV.        Frederik Pear 05/07/2022, 4:49 AM

## 2022-05-07 NOTE — Procedures (Signed)
Central Venous Catheter Insertion Procedure Note  TAIQUAN CAMPANARO  [id number]  Feb 26, 1956  Date:05/07/22  Time:6:11 AM   Provider Performing:Kisa Fujii D Rollene Rotunda   Procedure: Attempted to place Non-tunneled Central Venous Catheter(36556)with US guidance (12248)  and was unsuccessful  Indication(s) Hemodialysis  Consent Unable to obtain consent due to emergent nature of procedure.  Anesthesia Topical only with 1% lidocaine   Timeout Verified patient identification, verified procedure, site/side was marked, verified correct patient position, special equipment/implants available, medications/allergies/relevant history reviewed, required imaging and test results available.  Sterile Technique Maximal sterile technique including full sterile barrier drape, hand hygiene, sterile gown, sterile gloves, mask, hair covering, sterile ultrasound probe cover (if used).  Procedure Description Area of catheter insertion was cleaned with chlorhexidine and draped in sterile fashion.   With real-time ultrasound guidance a HD catheter was attempted to be placed in R IJ but was unsuccessful.  Complications/Tolerance None; patient tolerated the procedure well.  EBL Minimal  Specimen(s) None  Will put in IR consult for HD catheter placement  JD Rexene Agent [facility] 05/07/2022, 6:12 AM  Please see [email or web address] for pager details.  From 7A-7P if no response, please call [phone]. After hours, please call ELink [phone].

## 2022-05-07 NOTE — Progress Notes (Addendum)
NAME:  Erik Turner, MRN:  [id number], DOB:  09-26-1956, LOS: 1 ADMISSION DATE:  05/06/2022, CONSULTATION DATE:  5/20 REFERRING MD:  Dr. Francia Greaves, CHIEF COMPLAINT:  septic shock   History of Present Illness:  Patient is a 66 yo M w/ pertinent PMH of HTN, HLD, DMT2, surgical hx of colectomy with ostomy in place present to [facility] on 5/20 for acute encephalpathy.   Patient resides at [facility]. On 5/20 staff state he was found on floor less responsive. Unclear how long patient was on floor. Patient is alert to name, birth, and location but confused to year. Denies any pain. EMS called and patient noted to be hypotensive with SBP 70s. Given IV fluids in route and transported to [facility] ED. Upon arrival patient remains hypotensive and hypothermic 89 F. He is slow to respond but AO. WBC 31. Erythema noted in intertriginous area of groin without drainage. Patient given more IV fluids. Started on cefepime, vanc, and flagyl. Cultures sent. CXR and EKG no significant findings. Pertinent ED labs: Na 126, K 7.5, BUN 146, creat 14.38, Glucose 268, Hgb 13.4, trop 13, LA 7.6, ethanol WNL. PCCM consulted for icu admission  Pertinent  Medical History   Past Medical History:  Diagnosis Date   Hypertension    Significant Hospital Events: Including procedures, antibiotic start and stop dates in addition to other pertinent events   5/20 admitted to ICU with septic shock  Interim History / Subjective:  Overnight had significant acidosis and hyperkalemia. Nephrology then consulted. Unsuccessful placement of HD catheter, pending IR consult.  Objective   Blood pressure 96/61, pulse (!) 103, temperature (!) 97.5 F (36.4 C), temperature source Bladder, resp. rate (!) 21, weight 77.1 kg, SpO2 100 %.        Intake/Output Summary (Last 24 hours) at 05/07/2022 0707 Last data filed at 05/07/2022 0600 Gross per 24 hour  Intake 3280.53 ml  Output 500 ml  Net 2780.53 ml   Filed Weights   05/06/22 2115  Weight: 77.1  kg   Examination: General: Resting in bed, no acute distress HENT: Normocephalic, atraumatic. Dry mucous membranes. Lungs: Normal respiratory effort on room air. Clear to ausculation bilaterally. Cardiovascular: Tachycardic, regular rhythm. No murmurs appreciated. Distal pulses 2+ bilaterally. Abdomen: Soft, non-tender, non-distended. Normoactive bowel sounds. Extremities: Warm, dry. No peripheral edema noted. Neuro: Awake, alert. Throughout interview staring at wall. Oriented to person only. Grossly non-focal.  Assessment & Plan:  #Shock in setting of COVID-19 infection Overnight afebrile, continues to be mildly tachycardic, MAP's appropriate off of pressors. He is currently not hypoxic, will hold off antivirals or steroids.  - Continue vancomycin, cefepime daily - Follow cultures - Pressors as needed for MAP >65 - Hold home antihypertensives - Airborne, contact precautions  #Acute renal failure in setting of hypoperfusion #Severe hyperkalemia #Lactic acidosis Potassium normalized overnight, did receive calcium gluconate/bicarb/insulin previously for initial K >7 . Bicarb, sCr mildly improved overnight with fluids. No recorded urine output. Discussed with nephrology, will hold off CRRT today and see if he is able to make any urine.  - Appreciate nephrology recommendations - Hold off temporary access placement today, repeat labs in AM - Bicarb gtt - Lokelma 10g - Strict I/O  #Acute metabolic encephalopathy Continues to be significantly encephalopathic, only oriented to person today. Grossly non-focal on exam. Likely metabolic in setting of [id number] infection.  - Plan as above - Delirium precautions  #Hyponatremia Na 134 this morning, up from 128 overnight. Improving with fluids. Check daily BMP.  #  Type II diabetes mellitus A1c 6.6%. Sugars at goal, will continue with current regimen.  - SSI  #L intertriginous candidiasis - Continue nytstatin  Best Practice (right click  and "Reselect all SmartList Selections" daily)   Diet/type: NPO DVT prophylaxis: LMWH GI prophylaxis: N/A Lines: N/A Foley:  Yes, and it is still needed Code Status:  full code Last date of multidisciplinary goals of care discussion [n/a]  Labs   CBC: Recent Labs  Lab 05/06/22 1915 05/06/22 1955 05/06/22 2130 05/06/22 2221 05/07/22 0109 05/07/22 0329  WBC 31.6*  --  33.5*  --   --  22.3*  NEUTROABS 27.9*  --   --   --   --   --   HGB 13.4 11.9* 12.8* 12.9* 11.2* 12.0*  HCT 39.1 35.0* 37.1* 38.0* 33.0* 32.8*  MCV 88.7  --  88.5  --   --  83.7  PLT 299  --  260  --   --  725    Basic Metabolic Panel: Recent Labs  Lab 05/06/22 1915 05/06/22 1955 05/06/22 2130 05/06/22 2221 05/07/22 0018 05/07/22 0109 05/07/22 0329  NA 126* 125*  --  126* 128* 129* 134*  K >7.5* 7.1*  --  6.5* 6.7* 5.7* 5.1  CL 99 103  --   --  100  --  101  CO2 <7*  --   --   --  8*  --  11*  GLUCOSE 268* 231*  --   --  179*  --  130*  BUN 146* >130*  --   --  141*  --  143*  CREATININE 14.38* 15.60* 13.53*  --  13.59*  --  12.93*  CALCIUM 8.2*  --   --   --  8.5*  --  8.3*  MG  --   --  1.8  --   --   --   --   PHOS  --   --  9.0*  --   --   --   --    GFR: Estimated Creatinine Clearance: 5.4 mL/min (A) (by C-G formula based on SCr of 12.93 mg/dL (H)). Recent Labs  Lab 05/06/22 1915 05/06/22 2130 05/07/22 0016 05/07/22 0018 05/07/22 0329  PROCALCITON  --  0.65  --  0.80  --   WBC 31.6* 33.5*  --   --  22.3*  LATICACIDVEN 7.6*  --  4.9*  --  3.5*    Liver Function Tests: Recent Labs  Lab 05/06/22 1915  AST 19  ALT 20  ALKPHOS 68  BILITOT 0.6  PROT 6.6  ALBUMIN 3.5   No results for input(s): LIPASE, AMYLASE in the last 168 hours. Recent Labs  Lab 05/06/22 2130  AMMONIA 26    ABG    Component Value Date/Time   PHART 7.293 (L) 05/07/2022 0109   PCO2ART 18.0 (LL) 05/07/2022 0109   PO2ART 102 05/07/2022 0109   HCO3 8.7 (L) 05/07/2022 0109   TCO2 9 (L) 05/07/2022 0109    ACIDBASEDEF 16.0 (H) 05/07/2022 0109   O2SAT 98 05/07/2022 0109     Coagulation Profile: Recent Labs  Lab 05/06/22 1915  INR 1.4*    Cardiac Enzymes: Recent Labs  Lab 05/06/22 1915  CKTOTAL 116    HbA1C: Hgb A1c MFr Bld  Date/Time Value Ref Range Status  05/06/2022 09:30 PM 6.6 (H) 4.8 - 5.6 % Final    Comment:    (NOTE) Pre diabetes:          5.7%-6.4%  Diabetes:              >6.4%  Glycemic control for   <7.0% adults with diabetes   03/11/2022 01:08 AM 8.1 (H) 4.8 - 5.6 % Final    Comment:    (NOTE) Pre diabetes:          5.7%-6.4%  Diabetes:              >6.4%  Glycemic control for   <7.0% adults with diabetes     CBG: Recent Labs  Lab 05/06/22 2219 05/06/22 2239 05/07/22 0013 05/07/22 0401  GLUCAP 211* 205* 180* 138*    Critical care time: 35 minutes    Sanjuan Dame, MD Internal Medicine Resident PGY-2 Pager: [phone]

## 2022-05-07 NOTE — Progress Notes (Signed)
Patient with left arm infiltrate from previous IV prior to patient arriving to the floor from ED. Patient arm worsening, warm to touch, and increase swelling.  Elevated arm, applied ice, and dressed wound with petroleum guaze and foam.

## 2022-05-07 NOTE — Progress Notes (Signed)
Brief nephrology follow-up: Please see consult note from earlier today for details.  The labs significantly improved.  Potassium level is normal.  Creatinine level is trending down.  I will continue IV fluid.  Strict ins and out and daily lab.  No plan for dialysis or CRRT today.  We will reassess tomorrow morning.  Discussed with ICU team.

## 2022-05-07 NOTE — Progress Notes (Signed)
eLink Physician-Brief Progress Note Patient Name: Erik Turner DOB: 1956/02/25 MRN: [id number]   Date of Service  05/07/2022  HPI/Events of Note  New onset atrial fibrillation with RVR (heart rate 120's to 140), he is hemodynamically stable. QTC 467. Recent K+ 4.7, no Mg+ + in 24 hours.  eICU Interventions  Amiodarone 150 mg iv x 1 ordered to try to terminate the rhythm. Will check a serum Mg++ level and replace if indicated.        Frederik Pear 05/07/2022, 10:56 PM

## 2022-05-07 NOTE — Progress Notes (Signed)
Updated by Dr. Carolin Sicks about plans  Continue medical management  Plan for possible CRRT if renal function and hyperkalemia does not continue to stabilize with medical management  On isolation for COVID  Patient evaluated at 0200 hrs. 05/07/2022

## 2022-05-08 DIAGNOSIS — R6521 Severe sepsis with septic shock: Secondary | ICD-10-CM | POA: Diagnosis not present

## 2022-05-08 DIAGNOSIS — A419 Sepsis, unspecified organism: Secondary | ICD-10-CM | POA: Diagnosis not present

## 2022-05-08 LAB — BASIC METABOLIC PANEL
Anion gap: 23 — ABNORMAL HIGH (ref 5–15)
BUN: 130 mg/dL — ABNORMAL HIGH (ref 8–23)
CO2: 16 mmol/L — ABNORMAL LOW (ref 22–32)
Calcium: 7.5 mg/dL — ABNORMAL LOW (ref 8.9–10.3)
Chloride: 96 mmol/L — ABNORMAL LOW (ref 98–111)
Creatinine, Ser: 10.1 mg/dL — ABNORMAL HIGH (ref 0.61–1.24)
GFR, Estimated: 5 mL/min — ABNORMAL LOW (ref >60)
Glucose, Bld: 133 mg/dL — ABNORMAL HIGH (ref 70–99)
Potassium: 3.4 mmol/L — ABNORMAL LOW (ref 3.5–5.1)
Sodium: 135 mmol/L (ref 135–145)

## 2022-05-08 LAB — CBC
HCT: 28.4 % — ABNORMAL LOW (ref 39.0–52.0)
Hemoglobin: 10.4 g/dL — ABNORMAL LOW (ref 13.0–17.0)
MCH: 30.1 pg (ref 26.0–34.0)
MCHC: 36.6 g/dL — ABNORMAL HIGH (ref 30.0–36.0)
MCV: 82.3 fL (ref 80.0–100.0)
Platelets: 132 10*3/uL — ABNORMAL LOW (ref 150–400)
RBC: 3.45 MIL/uL — ABNORMAL LOW (ref 4.22–5.81)
RDW: 13.6 % (ref 11.5–15.5)
WBC: 9.6 10*3/uL (ref 4.0–10.5)
nRBC: 0 % (ref 0.0–0.2)

## 2022-05-08 LAB — GLUCOSE, CAPILLARY
Glucose-Capillary: 136 mg/dL — ABNORMAL HIGH (ref 70–99)
Glucose-Capillary: 167 mg/dL — ABNORMAL HIGH (ref 70–99)
Glucose-Capillary: 170 mg/dL — ABNORMAL HIGH (ref 70–99)
Glucose-Capillary: 181 mg/dL — ABNORMAL HIGH (ref 70–99)
Glucose-Capillary: 190 mg/dL — ABNORMAL HIGH (ref 70–99)
Glucose-Capillary: 221 mg/dL — ABNORMAL HIGH (ref 70–99)
Glucose-Capillary: 221 mg/dL — ABNORMAL HIGH (ref 70–99)

## 2022-05-08 LAB — URINE CULTURE: Culture: NO GROWTH

## 2022-05-08 LAB — LACTIC ACID, PLASMA: Lactic Acid, Venous: 1.6 mmol/L (ref 0.5–1.9)

## 2022-05-08 LAB — MAGNESIUM: Magnesium: 1.4 mg/dL — ABNORMAL LOW (ref 1.7–2.4)

## 2022-05-08 LAB — PROCALCITONIN: Procalcitonin: 1.68 ng/mL

## 2022-05-08 MED ORDER — ALBUTEROL SULFATE (2.5 MG/3ML) 0.083% IN NEBU: 2.5000 mg | INHALATION_SOLUTION | RESPIRATORY_TRACT | Status: DC | PRN

## 2022-05-08 MED ORDER — MAGNESIUM SULFATE 4 GM/100ML IV SOLN
4.0000 g | Freq: Once | INTRAVENOUS | Status: AC
  Administered 2022-05-08: 4 g via INTRAVENOUS
  Filled 2022-05-08: qty 100

## 2022-05-08 MED ORDER — RISPERIDONE 1 MG PO TBDP: 1.0000 mg | ORAL_TABLET | Freq: Every day | ORAL | Status: DC | PRN

## 2022-05-08 MED ORDER — STERILE WATER FOR INJECTION IV SOLN
INTRAVENOUS | Status: DC
  Filled 2022-05-08 (×2): qty 1000

## 2022-05-08 MED ORDER — IPRATROPIUM-ALBUTEROL 0.5-2.5 (3) MG/3ML IN SOLN
3.0000 mL | Freq: Three times a day (TID) | RESPIRATORY_TRACT | Status: DC
  Administered 2022-05-09 (×2): 3 mL via RESPIRATORY_TRACT
  Filled 2022-05-08 (×3): qty 3

## 2022-05-08 MED ORDER — ADULT MULTIVITAMIN W/MINERALS CH
1.0000 | ORAL_TABLET | Freq: Every day | ORAL | Status: DC
  Administered 2022-05-09 – 2022-05-13 (×4): 1
  Filled 2022-05-08 (×5): qty 1

## 2022-05-08 MED ORDER — AMIODARONE HCL IN DEXTROSE 360-4.14 MG/200ML-% IV SOLN
60.0000 mg/h | INTRAVENOUS | Status: DC
  Administered 2022-05-08: 60 mg/h via INTRAVENOUS
  Filled 2022-05-08: qty 200

## 2022-05-08 MED ORDER — AMIODARONE HCL IN DEXTROSE 360-4.14 MG/200ML-% IV SOLN
30.0000 mg/h | INTRAVENOUS | Status: DC
  Administered 2022-05-08: 30 mg/h via INTRAVENOUS
  Filled 2022-05-08: qty 200

## 2022-05-08 MED ORDER — IPRATROPIUM-ALBUTEROL 0.5-2.5 (3) MG/3ML IN SOLN
3.0000 mL | RESPIRATORY_TRACT | Status: DC
  Administered 2022-05-08 (×3): 3 mL via RESPIRATORY_TRACT
  Filled 2022-05-08 (×3): qty 3

## 2022-05-08 MED ORDER — PROSOURCE TF PO LIQD
45.0000 mL | Freq: Three times a day (TID) | ORAL | Status: DC
  Administered 2022-05-08 – 2022-05-12 (×10): 45 mL
  Filled 2022-05-08 (×13): qty 45

## 2022-05-08 MED ORDER — RISPERIDONE 0.5 MG PO TABS
1.0000 mg | ORAL_TABLET | Freq: Two times a day (BID) | ORAL | Status: DC | PRN
  Administered 2022-05-08: 1 mg
  Filled 2022-05-08: qty 2

## 2022-05-08 MED ORDER — AMIODARONE HCL 200 MG PO TABS
400.0000 mg | ORAL_TABLET | Freq: Two times a day (BID) | ORAL | Status: DC
  Administered 2022-05-08 (×2): 400 mg
  Filled 2022-05-08 (×2): qty 2

## 2022-05-08 MED ORDER — INSULIN ASPART 100 UNIT/ML IJ SOLN
0.0000 [IU] | INTRAMUSCULAR | Status: DC
  Administered 2022-05-08: 5 [IU] via SUBCUTANEOUS
  Administered 2022-05-08: 3 [IU] via SUBCUTANEOUS
  Administered 2022-05-08 – 2022-05-09 (×3): 5 [IU] via SUBCUTANEOUS
  Administered 2022-05-09: 8 [IU] via SUBCUTANEOUS
  Administered 2022-05-09: 5 [IU] via SUBCUTANEOUS
  Administered 2022-05-09: 3 [IU] via SUBCUTANEOUS
  Administered 2022-05-10 (×5): 2 [IU] via SUBCUTANEOUS
  Administered 2022-05-11: 3 [IU] via SUBCUTANEOUS
  Administered 2022-05-11 (×2): 2 [IU] via SUBCUTANEOUS
  Administered 2022-05-11: 3 [IU] via SUBCUTANEOUS
  Administered 2022-05-11 – 2022-05-12 (×3): 2 [IU] via SUBCUTANEOUS

## 2022-05-08 MED ORDER — RISPERIDONE 0.5 MG PO TABS
1.0000 mg | ORAL_TABLET | Freq: Every day | ORAL | Status: DC | PRN
  Administered 2022-05-08: 1 mg
  Filled 2022-05-08: qty 2

## 2022-05-08 MED ORDER — STERILE WATER FOR INJECTION IV SOLN
INTRAVENOUS | Status: AC
  Filled 2022-05-08 (×2): qty 1000

## 2022-05-08 MED ORDER — POTASSIUM CHLORIDE 20 MEQ PO PACK
20.0000 meq | PACK | Freq: Once | ORAL | Status: AC
  Administered 2022-05-08: 20 meq via NASOGASTRIC
  Filled 2022-05-08: qty 1

## 2022-05-08 MED ORDER — OSMOLITE 1.2 CAL PO LIQD
1000.0000 mL | ORAL | Status: DC
  Administered 2022-05-08 – 2022-05-10 (×3): 1000 mL
  Filled 2022-05-08 (×12): qty 1000

## 2022-05-08 NOTE — Progress Notes (Signed)
  Amiodarone Drug - Drug Interaction Consult Note  Recommendations: Amiodarone is metabolized by the cytochrome P450 system and therefore has the potential to cause many drug interactions. Amiodarone has an average plasma half-life of 50 days (range 20 to 100 days).   There is potential for drug interactions to occur several weeks or months after stopping treatment and the onset of drug interactions may be slow after initiating amiodarone.   '[]'$  Statins: Increased risk of myopathy. Simvastatin- restrict dose to '20mg'$  daily. Other statins: counsel patients to report any muscle pain or weakness immediately.  '[]'$  Anticoagulants: Amiodarone can increase anticoagulant effect. Consider warfarin dose reduction. Patients should be monitored closely and the dose of anticoagulant altered accordingly, remembering that amiodarone levels take several weeks to stabilize.  '[]'$  Antiepileptics: Amiodarone can increase plasma concentration of phenytoin, the dose should be reduced. Note that small changes in phenytoin dose can result in large changes in levels. Monitor patient and counsel on signs of toxicity.  '[]'$  Beta blockers: increased risk of bradycardia, AV block and myocardial depression. Sotalol - avoid concomitant use.  '[]'$   Calcium channel blockers (diltiazem and verapamil): increased risk of bradycardia, AV block and myocardial depression.  '[]'$   Cyclosporine: Amiodarone increases levels of cyclosporine. Reduced dose of cyclosporine is recommended.  '[]'$  Digoxin dose should be halved when amiodarone is started.  '[]'$  Diuretics: increased risk of cardiotoxicity if hypokalemia occurs.  '[]'$  Oral hypoglycemic agents (glyburide, glipizide, glimepiride): increased risk of hypoglycemia. Patient's glucose levels should be monitored closely when initiating amiodarone therapy.   '[]'$  Drugs that prolong the QT interval:  Torsades de pointes risk may be increased with concurrent use - avoid if possible.  Monitor QTc, also  keep magnesium/potassium WNL if concurrent therapy can't be avoided.  Antibiotics: e.g. fluoroquinolones, erythromycin.  Antiarrhythmics: e.g. quinidine, procainamide, disopyramide, sotalol.  Antipsychotics: e.g. phenothiazines, haloperidol.   Lithium, tricyclic antidepressants, and methadone.  Thank you for allowing pharmacy to be a part of this patient's care.  Ardyth Harps, PharmD Clinical Pharmacist

## 2022-05-08 NOTE — Evaluation (Signed)
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 male, resident of [place], admitted 5/20 after being found down, unresponsive. Dx shock in setting of COVID 19 infection, renal failure, encephalopathy. PMHx HTN, HLD, DMT2, surgical hx of colectomy with ostomy.    Assessment / Plan / Recommendation  Clinical Impression  Pt poorly participatory; not following commands.  Able to state his name only. He accepted ice chips, chewed them and swallowed with no obvious difficulty.  Water spilled from his mouth with no effort to contain or swallow.  He drifted in and out of sleep with no ability to sustain attention to task.  Large bore NG in place but no TF.  Recommend continuing NPO for now until MS improves; he may have ice chips when awake. SLP will follow for PO readiness. SLP Visit Diagnosis: Dysphagia, unspecified (R13.10)           Diet Recommendation   NPO except ice chips  Medication Administration: Via alternative means    Other  Recommendations Oral Care Recommendations: Oral care QID;Oral care prior to ice chip/H20    Recommendations for follow up therapy are one component of a multi-disciplinary discharge planning process, led by the attending physician.  Recommendations may be updated based on patient status, additional functional criteria and insurance authorization.  Follow up Recommendations Other (comment) (tba)      Assistance Recommended at Discharge Frequent or constant Supervision/Assistance  Functional Status Assessment    Frequency and  Duration min 2x/week  1 week       Prognosis Prognosis for Safe Diet Advancement: Good      Swallow Study   General Date of Onset: 05/06/22 HPI: 66 y.o. male, resident of [place], admitted 5/20 after being found down, unresponsive. Dx shock in setting of COVID 19 infection, renal failure, encephalopathy. PMHx HTN, HLD, DMT2, surgical hx of colectomy with ostomy. Type of Study: Bedside Swallow Evaluation Previous Swallow Assessment: none per records Diet Prior to this Study: NPO Temperature Spikes Noted: No Respiratory Status: Room air History of Recent Intubation: No Behavior/Cognition: Lethargic/Drowsy Oral Cavity Assessment: Within Functional Limits Oral Care Completed by SLP: Recent completion by staff Self-Feeding Abilities: Total assist Patient Positioning: Upright in bed Baseline Vocal Quality: Low vocal intensity Volitional Cough: Cognitively unable to elicit Volitional Swallow: Unable to elicit    Oral/Motor/Sensory Function Overall Oral Motor/Sensory Function: Within functional limits (symmetric at rest)   Ice Chips Ice chips: Within functional limits   Thin Liquid Thin Liquid: Impaired Presentation: Straw Oral Phase Impairments: Poor awareness of bolus Oral Phase Functional Implications: Right anterior spillage;Left anterior spillage    Nectar Thick Nectar Thick Liquid: Not tested   Honey Thick Honey Thick Liquid: Not tested   Puree Puree: Not tested   Solid     Solid: Not tested      Juan Quam Laurice 05/08/2022,1:54 PM Estill Bamberg L. Tivis Ringer, [place] Office number [phone] Pager [phone]

## 2022-05-08 NOTE — Consult Note (Addendum)
[facility] Nurse Consult Note: Reason for Consult: Consult requested for left arm in the anti-cubital area; full thickness wound reported to be a result of a previous IV infiltrate. Pt is in isolation for Covid. 5X4X.2cm, red and moist with loose peeling skin to wound edges.  Small amt yellow drainage. Middle abd with previous blister site which has evolved into a partial thickness wound ; 3X3X.1cm, red and dry, no drainage or fluctuance. Dressing procedure/placement/frequency: Topical treatment orders provided for bedside nurses to perform as follows to promote healing: 1. Apply xeroform gauze to left arm wound Q day, then cover with foam dressing.  (Change foam dressing to left arm Q 3 days or PRN soiling.) 2. Foam dressing to abd, change Q 3 days or PRN soiling.  [facility] Nurse ostomy consult note Pt is familiar to [facility] team from previous ostomy consult on 3/24; he had ileostomy surgery performed in 1976 according to previous progress notes. He is currently not awake and is critically ill and requires total assistance with pouching activities.  Stoma type/location: Stoma is red and viable, flush with skin level, 3/4 inches.  Peristomal assessment: intact skin surrounding Output: 50cc loose green stool Ostomy pouching: Applied convex pouch and barrier ring and extra supplies left at the bedside for staff nurses' use. Use supplies: barrier ring, Lawson # [id number], and convex Roselee Culver # [id number].  Please re-consult if further assistance is needed.  Thank-you,  Julien Girt MSN, [place], [place], [place], [place]

## 2022-05-08 NOTE — Progress Notes (Signed)
eLink Physician-Brief Progress Note Patient Name: Erik Turner DOB: 06-Sep-1956 MRN: [id number]   Date of Service  05/08/2022  HPI/Events of Note  K+ 3.4, Bicarb 16, Bicarb gtt order expired.  eICU Interventions  KCL 20 meq via OG tube x 1 ordered, Bicarb gtt reordered x 8 hours at 75 ml / hour.        Erik Turner 05/08/2022, 4:21 AM

## 2022-05-08 NOTE — Progress Notes (Signed)
eLink Physician-Brief Progress Note Patient Name: Erik Turner DOB: 07-May-1956 MRN: [id number]   Date of Service  05/08/2022  HPI/Events of Note  Patient with persistent atrial fibrillation with RVR.  eICU Interventions  Amiodarone gtt ordered.        Erik Turner 05/08/2022, 12:15 AM

## 2022-05-08 NOTE — Progress Notes (Signed)
eLink Physician-Brief Progress Note Patient Name: XIAN ALVES DOB: 1956/01/16 MRN: [id number]   Date of Service  05/08/2022  HPI/Events of Note  Mg++ 1.4  eICU Interventions  Mg++ replaced per E-Link electrolyte replacement protocol.        Kerry Kass Jerrye Seebeck 05/08/2022, 2:17 AM

## 2022-05-08 NOTE — Progress Notes (Signed)
[facility] Progress Note   66 y.o. male with HTN HLD, DM, colectomy with ostomy found down on the floor, seen as a consultation for AKI and hyperkalemia.  The patient lives at [facility]; unclear how long he was on the floor. Hypotension SBP in 70s in field.  Give IV fluid and brought to the ER.  In the ER he was hypotensive to 70s, hypothermic to 89.  The lab showed potassium of 7.5, BUN 146, creatinine level around 15.6, lactic acid 7.6, severe acidotic, WBC 33.5.  He received 3 L of fluid resuscitation and started on broad-spectrum antibiotics with cefepime and vancomycin for septic shock.  CT scan of abdomen pelvis with no trauma or source of sepsis.  CT head with no acute finding however has atrophy and chronic small vessel ischemia.  Home medications include amlodipine, glipizide, losartan, metformin.   Assessment/ Plan:    1) Acute kidney injury in the setting of septic shock, renal hypoperfusion: Patient with severe hyperkalemia, hyponatremia and lactic acidosis.  Received fluid resuscitation in ER.  CT scan without hydronephrosis.  I will check UA.  Since potassium level is improving with medical management and UOP increasing (455m from this am already), I will increase IV fluid rate to 100 ml/hr. Will hold off on CRRT for now; not on pressors but on amiodarone for afib rvr.  Continue strict ins and outs and close monitoring of the lab.   2) Severe hyperkalemia in the setting of AKI, losartan use.  Received medical treatment including calcium, insulin, dextrose, bicarbonate and Lokelma.  The potassium level improved to 3.4.  I will continue medical treatment and monitor lab.   3) Septic shock: Source unknown.  Currently on broad-spectrum antibiotics with vancomycin and cefepime.  CT scan did not show source of infection.  The repeat blood pressure around 90-110s without pressors.     4) Hyponatremia due to dehydration: Sodium level improving with IV fluid.  Monitor  lab.   5) Severe metabolic and lactic acidosis: Due to shock and AKI.  Lactic acid level improving with current management.  Increased sodium bicarbonate to 1049mhr (no issues with oxygenation and UOP increasing.  Subjective:   Confused but pleasant on amiodarone started overnight for afib; not on pressors. UOP 40070mince this am already   Objective:   BP (!) 106/59   Pulse 88   Temp 99.1 F (37.3 C)   Resp (!) 26   Wt 105.5 kg   SpO2 96%   BMI 37.54 kg/m   Intake/Output Summary (Last 24 hours) at 05/08/2022 0859 Last data filed at 05/08/2022 0800 Gross per 24 hour  Intake 3603.9 ml  Output 1365 ml  Net 2238.9 ml   Weight change: 28.4 kg  Physical Exam: General exam: Confused male lying on bed, not in distress, dry oral mucous membrane. Respiratory system: Clear to auscultation. Respiratory effort normal.  Cardiovascular system: S1 & S2 heard, no pedal edema. Gastrointestinal system: Mild tenderness and firm but no rebound Central nervous system:  alert awake but disoriented Extremities: No edema, no cyanosis Skin: No rashes, lesions or ulcers  Imaging: CT ABDOMEN PELVIS WO CONTRAST  Result Date: 05/06/2022 CLINICAL DATA:  Sepsis, fall injury. EXAM: CT ABDOMEN AND PELVIS WITHOUT CONTRAST TECHNIQUE: Multidetector CT imaging of the abdomen and pelvis was performed following the standard protocol without IV contrast. RADIATION DOSE REDUCTION: This exam was performed according to the departmental dose-optimization program which includes automated exposure control, adjustment of the mA and/or kV according to  patient size and/or use of iterative reconstruction technique. COMPARISON:  CT abdomen and pelvis with contrast 03/10/2022. FINDINGS: Factors affecting image quality: Respiratory motion limits image resolution in the abdomen, as well as streak artifact from the patient's arms in the field. Lower chest: No infiltrate is seen. The cardiac size is normal. There are coronary artery  calcifications. Hepatobiliary: The liver is steatotic as before. No focal abnormality is seen through the breathing motion. There are stones in the gallbladder without obvious wall thickening, no biliary dilatation. Pancreas: No abnormality is seen through the breathing motion. No inflammatory change or ductal dilatation. Spleen: No abnormality is seen through the breathing motion. No splenomegaly. Adrenals/Urinary Tract: Slight nodular thickening of the adrenal glands is unchanged. No adrenal or perirenal hemorrhage. No obvious renal cortical abnormality is seen through the breathing motion. There is no urinary stone or obstruction. The bladder is contracted catheterized and not well seen. Stomach/Bowel: Previous total colectomy again noted. Left lower quadrant ileostomy. Mild fluid distention is increased in the stomach. No bowel obstruction or inflammatory changes are seen. Presacral postsurgical changes are again shown. 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 This was also seen on prostate biopsy CT images of 03/02/2017 and unchanged since then as well. There are phleboliths left hemipelvis. Vascular/Lymphatic: Mild aortic atherosclerosis.  No adenopathy. Reproductive: There has been a prior prostatectomy. Other: There small left paraumbilical and bilateral inguinal fat hernias. There is no incarcerated hernia. No left lower quadrant parastomal hernia. Cutaneous and subcutaneous scarring noted right lower abdomen anterior wall. There is no free air, hemorrhage or fluid. Musculoskeletal: There is osteopenia mild degenerative change of the spine. Mild features of symmetric nonerosive sacroiliitis. No acute renal regional skeletal fracture is seen. IMPRESSION: 1. Motion limited exam.  No obvious acute trauma related findings. 2. No etiology for sepsis is seen through the motion artifact. 3. Postsurgical and additional chronic  findings. 4. Cholelithiasis without obvious gallbladder thickening or pericholecystic reaction. 5. Aortic and coronary artery atherosclerosis. Electronically Signed   By: Telford Nab M.D.   On: 05/06/2022 22:20   DG Abd 1 View  Result Date: 05/07/2022 CLINICAL DATA:  NG placement. EXAM: ABDOMEN - 1 VIEW COMPARISON:  CT abdomen pelvis dated 05/06/2022. FINDINGS: Enteric tube with tip in the distal stomach.  No bowel dilatation. IMPRESSION: Enteric tube with tip in the distal stomach. Electronically Signed   By: Anner Crete M.D.   On: 05/07/2022 02:52   CT Head Wo Contrast  Result Date: 05/06/2022 CLINICAL DATA:  Mental status change, unknown cause EXAM: CT HEAD WITHOUT CONTRAST TECHNIQUE: Contiguous axial images were obtained from the base of the skull through the vertex without intravenous contrast. RADIATION DOSE REDUCTION: This exam was performed according to the departmental dose-optimization program which includes automated exposure control, adjustment of the mA and/or kV according to patient size and/or use of iterative reconstruction technique. COMPARISON:  Head CT 03/10/2022 FINDINGS: Brain: Submental exam performed due to patient motion and technical issues with CT scanner. This limits assessment. Allowing for this there is no evidence of hemorrhage, acute ischemia, midline shift or mass effect. Stable atrophy and periventricular white matter hypodensity. Vascular: No hyperdense vessel. Skull: No skull fracture. Stable sclerotic exostosis left frontal parietal bone. Sinuses/Orbits: Paranasal sinuses and mastoid air cells are clear. The visualized orbits are unremarkable. Other: None. IMPRESSION: 1. No acute intracranial abnormality. 2. Stable atrophy and chronic small vessel ischemia.  Electronically Signed   By: Keith Rake M.D.   On: 05/06/2022 22:01   CT Cervical Spine Wo Contrast  Result Date: 05/06/2022 CLINICAL DATA:  Neck trauma.  Fall. EXAM: CT CERVICAL SPINE WITHOUT CONTRAST  TECHNIQUE: Multidetector CT imaging of the cervical spine was performed without intravenous contrast. Multiplanar CT image reconstructions were also generated. RADIATION DOSE REDUCTION: This exam was performed according to the departmental dose-optimization program which includes automated exposure control, adjustment of the mA and/or kV according to patient size and/or use of iterative reconstruction technique. COMPARISON:  None Available. FINDINGS: Factors affecting image quality: There is beam hardening and loss of fine detail below the level of C4 due to superimposition of the patient's shoulders and there is mild motion artifact through the lower cervical levels. Alignment: Normal. Skull base and vertebrae: Osteopenia. The skull base is intact. The mastoid air cells, middle ear cavities are clear. No spinal compression fracture is seen. There is no displaced fracture visible through the artifacts. Soft tissues and spinal canal: No prevertebral fluid or swelling. No visible canal hematoma. Disc levels: There are normal disc heights at C2-3 and C3-4. There is partial disc space loss at C4-5, C5-6, C6-7 and C7-T1 with small bidirectional osteophytes. There are posterior disc osteophyte complexes mildly encroaching on the ventral cord surface at C5-6 and C6-7. Other levels do not show significant soft tissue or bony encroachment on the thecal sac. Incidentally noted is narrowing and osteophyte formation of the anterior atlantodental joint. Facet joint and uncinate spurring is seen at most levels, left-greater-than-right and greatest at C2-3 and C3-4. Acquired degenerative foraminal stenosis is severe on the left at C2-3, moderate on the left at C3-4, bilaterally mild at C4-5, bilaterally moderate at C5-6 and C6-7. Upper chest: Negative. Other: None. IMPRESSION: Osteopenia and degenerative change without evidence of fractures or malalignment, but with limited image detail below C4. Electronically Signed   By: Telford Nab M.D.   On: 05/06/2022 22:30   DG Chest Port 1 View  Result Date: 05/07/2022 CLINICAL DATA:  66 year old male with history of acute respiratory failure with hypoxia. EXAM: PORTABLE CHEST 1 VIEW COMPARISON:  Chest x-ray 05/06/2022. FINDINGS: Nasogastric tube extends into the distal antral pre-pyloric region of the stomach or the proximal duodenum. Lung volumes are normal. Mild elevation of the right hemidiaphragm. No consolidative airspace disease. No pleural effusions. No pneumothorax. No pulmonary nodule or mass noted. Pulmonary vasculature and the cardiomediastinal silhouette are within normal limits. IMPRESSION: 1. Support apparatus, as above. 2. No radiographic evidence of acute cardiopulmonary disease. Electronically Signed   By: Vinnie Langton M.D.   On: 05/07/2022 07:16   DG Chest Port 1 View  Result Date: 05/06/2022 CLINICAL DATA:  Shortness of breath EXAM: PORTABLE CHEST 1 VIEW COMPARISON:  March 10, 2022 FINDINGS: Lung volumes are low. No new consolidation or edema. No pleural effusion or pneumothorax. Similar cardiomediastinal contours allowing for differences in technique. IMPRESSION: No acute process in the chest. Electronically Signed   By: Macy Mis M.D.   On: 05/06/2022 19:28    Labs: BMET Recent Labs  Lab 05/06/22 1915 05/06/22 1955 05/06/22 2130 05/06/22 2221 05/07/22 0018 05/07/22 0109 05/07/22 0329 05/07/22 1001 05/08/22 0030  NA 126* 125*  --  126* 128* 129* 134*  --  135  K >7.5* 7.1*  --  6.5* 6.7* 5.7* 5.1 4.7 3.4*  CL 99 103  --   --  100  --  101  --  96*  CO2 <7*  --   --   --  8*  --  11*  --  16*  GLUCOSE 268* 231*  --   --  179*  --  130*  --  133*  BUN 146* >130*  --   --  141*  --  143*  --  130*  CREATININE 14.38* 15.60* 13.53*  --  13.59*  --  12.93*  --  10.10*  CALCIUM 8.2*  --   --   --  8.5*  --  8.3*  --  7.5*  PHOS  --   --  9.0*  --   --   --   --   --   --    CBC Recent Labs  Lab 05/06/22 1915 05/06/22 1955 05/06/22 2130  05/06/22 2221 05/07/22 0109 05/07/22 0329 05/08/22 0030  WBC 31.6*  --  33.5*  --   --  22.3* 9.6  NEUTROABS 27.9*  --   --   --   --   --   --   HGB 13.4   < > 12.8* 12.9* 11.2* 12.0* 10.4*  HCT 39.1   < > 37.1* 38.0* 33.0* 32.8* 28.4*  MCV 88.7  --  88.5  --   --  83.7 82.3  PLT 299  --  260  --   --  186 132*   < > = values in this interval not displayed.    Medications:     chlorhexidine  15 mL Mouth Rinse BID   Chlorhexidine Gluconate Cloth  6 each Topical Daily   heparin  5,000 Units Subcutaneous Q8H   insulin aspart  0-6 Units Subcutaneous Q4H   mouth rinse  15 mL Mouth Rinse q12n4p   nystatin cream   Topical TID   vancomycin variable dose per unstable renal function (pharmacist dosing)   Does not apply See admin instructions      Otelia Santee, MD 05/08/2022, 8:59 AM

## 2022-05-08 NOTE — Progress Notes (Signed)
NAME:  Erik Turner, MRN:  [id number], DOB:  November 01, 1956, LOS: 2 ADMISSION DATE:  05/06/2022, CONSULTATION DATE:  5/20 REFERRING MD:  Dr. Francia Greaves, CHIEF COMPLAINT:  septic shock   History of Present Illness:  Patient is a 66 yo M w/ pertinent PMH of HTN, HLD, DMT2, surgical hx of colectomy with ostomy in place present to [facility] on 5/20 for acute encephalpathy.   Patient resides at [facility]. On 5/20 staff state he was found on floor less responsive. Unclear how long patient was on floor. Patient is alert to name, birth, and location but confused to year. Denies any pain. EMS called and patient noted to be hypotensive with SBP 70s. Given IV fluids in route and transported to [facility] ED. Upon arrival patient remains hypotensive and hypothermic 89 F. He is slow to respond but AO. WBC 31. Erythema noted in intertriginous area of groin without drainage. Patient given more IV fluids. Started on cefepime, vanc, and flagyl. Cultures sent. CXR and EKG no significant findings. Pertinent ED labs: Na 126, K 7.5, BUN 146, creat 14.38, Glucose 268, Hgb 13.4, trop 13, LA 7.6, ethanol WNL. PCCM consulted for icu admission  Pertinent  Medical History   Past Medical History:  Diagnosis Date   Hypertension    Significant Hospital Events: Including procedures, antibiotic start and stop dates in addition to other pertinent events   5/20 admitted to ICU with septic shock  Interim History / Subjective:  Overnight developed atrial fibrillation with RVR, amiodarone gtt subsequently started.   Objective   Blood pressure 113/60, pulse (!) 106, temperature 99.3 F (37.4 C), resp. rate (!) 33, weight 105.5 kg, SpO2 96 %.        Intake/Output Summary (Last 24 hours) at 05/08/2022 1057 Last data filed at 05/08/2022 1000 Gross per 24 hour  Intake 3402.8 ml  Output 1685 ml  Net 1717.8 ml    Filed Weights   05/06/22 2115 05/08/22 0304  Weight: 77.1 kg 105.5 kg   Examination: General: Resting in bed, no acute  distress HENT: Normocephalic, atraumatic. Dry mucous membranes. Lungs: Normal respiratory effort on room air. Mild end-expiratory wheezing.  Cardiovascular: Tachycardic, irregular rhythm. No murmurs appreciated. Distal pulses 2+ bilaterally. Abdomen: Soft, non-tender, non-distended. Normoactive bowel sounds. Extremities: Warm, dry. No peripheral edema noted. Neuro: Awake, alert. Throughout interview staring at wall. Oriented to person only. Grossly non-focal.  Assessment & Plan:  #Shock in setting of COVID-19 infection MAP appropriate, does not need vasopressor support. Bcx negative x48h, plan to d/c antibiotics today. Continue to hold home antihypertensives. Holding off antivirals and steroids given he is not hypoxic. - D/C antibiotics - Pressors as needed for MAP >65 - Hold home antihypertensives - Airborne, contact precautions  #Acute renal failure in setting of hypoperfusion This morning, K mildly low 3.4. Bicarb improved to 16. He also had 800cc urine output yesterday. Hopeful for continued improvement of renal failure. Will continue holding off placing line.  - Decrease bicarb gtt 100/hr - Hold lokelma - Avoid nephrotoxins - Strict I/O  #New-onset atrial fibrillation with RVR Overnight developed Afib w/ RVR, amiodarone gtt started. No previous history of Afib. CHAD2-VASc2 3. Most likely developed in setting of COVID-19 infection. Will transition to po amiodarone today.  Will discuss starting anticoagulation. - Transition to amiodarone '400mg'$  twice daily - Will discuss anticoagulation in acute setting  #Acute metabolic encephalopathy Continues to be significantly encephalopathic, only oriented to person today. Grossly non-focal on exam. Likely metabolic in setting of JHERD-40 infection.  -  Plan as above - Delirium precautions  #End-expiratory wheezing Previous smoker. No PFT's available. Will start scheduled DuoNebs.   #Type II diabetes mellitus A1c 6.6%. Sugars at goal, will  continue with current regimen.  - SSI  #L intertriginous candidiasis - Continue nytstatin  Best Practice (right click and "Reselect all SmartList Selections" daily)   Diet/type: NPO DVT prophylaxis: prophylactic heparin  GI prophylaxis: N/A Lines: N/A Foley:  Yes, and it is still needed Code Status:  full code Last date of multidisciplinary goals of care discussion [n/a]  Labs   CBC: Recent Labs  Lab 05/06/22 1915 05/06/22 1955 05/06/22 2130 05/06/22 2221 05/07/22 0109 05/07/22 0329 05/08/22 0030  WBC 31.6*  --  33.5*  --   --  22.3* 9.6  NEUTROABS 27.9*  --   --   --   --   --   --   HGB 13.4   < > 12.8* 12.9* 11.2* 12.0* 10.4*  HCT 39.1   < > 37.1* 38.0* 33.0* 32.8* 28.4*  MCV 88.7  --  88.5  --   --  83.7 82.3  PLT 299  --  260  --   --  186 132*   < > = values in this interval not displayed.     Basic Metabolic Panel: Recent Labs  Lab 05/06/22 1915 05/06/22 1955 05/06/22 2130 05/06/22 2221 05/07/22 0018 05/07/22 0109 05/07/22 0329 05/07/22 1001 05/08/22 0030  NA 126* 125*  --  126* 128* 129* 134*  --  135  K >7.5* 7.1*  --  6.5* 6.7* 5.7* 5.1 4.7 3.4*  CL 99 103  --   --  100  --  101  --  96*  CO2 <7*  --   --   --  8*  --  11*  --  16*  GLUCOSE 268* 231*  --   --  179*  --  130*  --  133*  BUN 146* >130*  --   --  141*  --  143*  --  130*  CREATININE 14.38* 15.60* 13.53*  --  13.59*  --  12.93*  --  10.10*  CALCIUM 8.2*  --   --   --  8.5*  --  8.3*  --  7.5*  MG  --   --  1.8  --   --   --   --   --  1.4*  PHOS  --   --  9.0*  --   --   --   --   --   --     GFR: Estimated Creatinine Clearance: 8.1 mL/min (A) (by C-G formula based on SCr of 10.1 mg/dL (H)). Recent Labs  Lab 05/06/22 1915 05/06/22 2130 05/07/22 0016 05/07/22 0018 05/07/22 0329 05/08/22 0030 05/08/22 0725  PROCALCITON  --  0.65  --  0.80  --  1.68  --   WBC 31.6* 33.5*  --   --  22.3* 9.6  --   LATICACIDVEN 7.6*  --  4.9*  --  3.5*  --  1.6     Liver Function  Tests: Recent Labs  Lab 05/06/22 1915  AST 19  ALT 20  ALKPHOS 68  BILITOT 0.6  PROT 6.6  ALBUMIN 3.5    No results for input(s): LIPASE, AMYLASE in the last 168 hours. Recent Labs  Lab 05/06/22 2130  AMMONIA 26     ABG    Component Value Date/Time   PHART 7.293 (L) 05/07/2022 0109  PCO2ART 18.0 (LL) 05/07/2022 0109   PO2ART 102 05/07/2022 0109   HCO3 14.6 (L) 05/07/2022 1001   TCO2 9 (L) 05/07/2022 0109   ACIDBASEDEF 9.6 (H) 05/07/2022 1001   O2SAT 81.9 05/07/2022 1001      Coagulation Profile: Recent Labs  Lab 05/06/22 1915  INR 1.4*     Cardiac Enzymes: Recent Labs  Lab 05/06/22 1915  CKTOTAL 116     HbA1C: Hgb A1c MFr Bld  Date/Time Value Ref Range Status  05/06/2022 09:30 PM 6.6 (H) 4.8 - 5.6 % Final    Comment:    (NOTE) Pre diabetes:          5.7%-6.4%  Diabetes:              >6.4%  Glycemic control for   <7.0% adults with diabetes   03/11/2022 01:08 AM 8.1 (H) 4.8 - 5.6 % Final    Comment:    (NOTE) Pre diabetes:          5.7%-6.4%  Diabetes:              >6.4%  Glycemic control for   <7.0% adults with diabetes     CBG: Recent Labs  Lab 05/07/22 1555 05/07/22 1930 05/07/22 2314 05/08/22 0303 05/08/22 0741  GLUCAP 145* 138* 125* 136* 170*     Critical care time: 30 minutes    Sanjuan Dame, MD Internal Medicine Resident PGY-2 Pager: [phone]

## 2022-05-08 NOTE — Progress Notes (Addendum)
Initial Nutrition Assessment  DOCUMENTATION CODES:   Not applicable  INTERVENTION:   Initiate tube feeding via NG tube: Osmolite 1.2 at 30 ml/h, increase by 10 ml every 4 hours to goal rate of 70 ml/h (1680 ml per day) Prosource TF 45 ml TID  Provides 2136 kcal, 126 gm protein, 1378 ml free water daily.  Monitor phos, mag, and K and replete as necessary.   MVI with minerals via tube once daily.  NUTRITION DIAGNOSIS:   Increased nutrient needs related to acute illness (COVID-19) as evidenced by estimated needs.  GOAL:   Patient will meet greater than or equal to 90% of their needs  MONITOR:   TF tolerance, Labs, Diet advancement, Skin  REASON FOR ASSESSMENT:   Consult Enteral/tube feeding initiation and management  ASSESSMENT:   66 yo male admitted with acute renal failure after being found down at his SNF. Tested positive for COVID-19. PMH includes HTN, HLD, DM-2, colectomy, ileostomy, former smoker.  Discussed patient in ICU rounds and with RN today. Patient developed A fib with RVR last night. Currently not requiring pressors.    S/P swallow evaluation with SLP today. Recommendations to keep patient NPO with alternative nutrition. NG tube in place with tip in the distal stomach per x-ray. Per discussion in rounds, RD to order TF if patient fails swallow evaluation. Received MD Consult for TF initiation and management.  Labs reviewed. K 3.4, BUN 130, creat 10.10, phos 9, mag 1.4 CBG: 167-181  Medications reviewed and include Novolog. IVF: sodium bicarb in sterile water at 100 ml/h.  Weight history reviewed. Weight documented at 77.1 kg on 03/06/22. Currently 105.5 kg. Unsure cause of drastic weight change.   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  Estimated Nutritional Needs:   Kcal:  2000-2200  Protein:  115-130 gm  Fluid:  >/= 2 L    Lucas Mallow RD, LDN, CNSC Please refer to Amion for contact information.

## 2022-05-09 ENCOUNTER — Inpatient Hospital Stay (HOSPITAL_COMMUNITY): Payer: Medicare Other

## 2022-05-09 DIAGNOSIS — A419 Sepsis, unspecified organism: Secondary | ICD-10-CM | POA: Diagnosis not present

## 2022-05-09 DIAGNOSIS — R6521 Severe sepsis with septic shock: Secondary | ICD-10-CM | POA: Diagnosis not present

## 2022-05-09 LAB — CBC
HCT: 27.2 % — ABNORMAL LOW (ref 39.0–52.0)
Hemoglobin: 9.5 g/dL — ABNORMAL LOW (ref 13.0–17.0)
MCH: 29.7 pg (ref 26.0–34.0)
MCHC: 34.9 g/dL (ref 30.0–36.0)
MCV: 85 fL (ref 80.0–100.0)
Platelets: 104 10*3/uL — ABNORMAL LOW (ref 150–400)
RBC: 3.2 MIL/uL — ABNORMAL LOW (ref 4.22–5.81)
RDW: 13.6 % (ref 11.5–15.5)
WBC: 5.7 10*3/uL (ref 4.0–10.5)
nRBC: 0 % (ref 0.0–0.2)

## 2022-05-09 LAB — BASIC METABOLIC PANEL
Anion gap: 16 — ABNORMAL HIGH (ref 5–15)
Anion gap: 14 (ref 5–15)
BUN: 115 mg/dL — ABNORMAL HIGH (ref 8–23)
BUN: 106 mg/dL — ABNORMAL HIGH (ref 8–23)
CO2: 30 mmol/L (ref 22–32)
CO2: 32 mmol/L (ref 22–32)
Calcium: 7.9 mg/dL — ABNORMAL LOW (ref 8.9–10.3)
Calcium: 8.2 mg/dL — ABNORMAL LOW (ref 8.9–10.3)
Chloride: 90 mmol/L — ABNORMAL LOW (ref 98–111)
Chloride: 93 mmol/L — ABNORMAL LOW (ref 98–111)
Creatinine, Ser: 4.46 mg/dL — ABNORMAL HIGH (ref 0.61–1.24)
Creatinine, Ser: 3.18 mg/dL — ABNORMAL HIGH (ref 0.61–1.24)
GFR, Estimated: 14 mL/min — ABNORMAL LOW (ref >60)
GFR, Estimated: 21 mL/min — ABNORMAL LOW (ref >60)
Glucose, Bld: 243 mg/dL — ABNORMAL HIGH (ref 70–99)
Glucose, Bld: 252 mg/dL — ABNORMAL HIGH (ref 70–99)
Potassium: 3 mmol/L — ABNORMAL LOW (ref 3.5–5.1)
Potassium: 3.4 mmol/L — ABNORMAL LOW (ref 3.5–5.1)
Sodium: 136 mmol/L (ref 135–145)
Sodium: 139 mmol/L (ref 135–145)

## 2022-05-09 LAB — GLUCOSE, CAPILLARY
Glucose-Capillary: 234 mg/dL — ABNORMAL HIGH (ref 70–99)
Glucose-Capillary: 212 mg/dL — ABNORMAL HIGH (ref 70–99)
Glucose-Capillary: 233 mg/dL — ABNORMAL HIGH (ref 70–99)
Glucose-Capillary: 266 mg/dL — ABNORMAL HIGH (ref 70–99)
Glucose-Capillary: 155 mg/dL — ABNORMAL HIGH (ref 70–99)

## 2022-05-09 LAB — MAGNESIUM: Magnesium: 2.1 mg/dL (ref 1.7–2.4)

## 2022-05-09 MED ORDER — POTASSIUM CHLORIDE 20 MEQ PO PACK
40.0000 meq | PACK | Freq: Once | ORAL | Status: AC
  Administered 2022-05-09: 40 meq
  Filled 2022-05-09: qty 2

## 2022-05-09 MED ORDER — SODIUM CHLORIDE 0.9 % IV SOLN: INTRAVENOUS | Status: DC

## 2022-05-09 MED ORDER — POTASSIUM CHLORIDE CRYS ER 20 MEQ PO TBCR: 40.0000 meq | EXTENDED_RELEASE_TABLET | Freq: Once | ORAL | Status: DC

## 2022-05-09 MED ORDER — RISPERIDONE 0.5 MG PO TABS
0.5000 mg | ORAL_TABLET | Freq: Two times a day (BID) | ORAL | Status: DC | PRN
  Administered 2022-05-10: 0.5 mg
  Filled 2022-05-09: qty 1

## 2022-05-09 MED ORDER — INSULIN GLARGINE-YFGN 100 UNIT/ML ~~LOC~~ SOLN
10.0000 [IU] | Freq: Every day | SUBCUTANEOUS | Status: DC
  Administered 2022-05-09 – 2022-05-13 (×5): 10 [IU] via SUBCUTANEOUS
  Filled 2022-05-09 (×7): qty 0.1

## 2022-05-09 MED ORDER — IPRATROPIUM-ALBUTEROL 0.5-2.5 (3) MG/3ML IN SOLN: 3.0000 mL | RESPIRATORY_TRACT | Status: DC | PRN

## 2022-05-09 NOTE — Progress Notes (Signed)
eLink Physician-Brief Progress Note Patient Name: Erik Turner DOB: [birth year] MRN: [id number]   Date of Service  05/09/2022  HPI/Events of Note  K 3.4 Improving renal function  eICU Interventions  Replete PO K     Intervention Category Intermediate Interventions: Electrolyte abnormality - evaluation and management  Erik Turner 05/09/2022, 9:22 PM

## 2022-05-09 NOTE — Progress Notes (Signed)
NAME:  Erik Turner, MRN:  [id number], DOB:  June 20, 1956, LOS: 3 ADMISSION DATE:  05/06/2022, CONSULTATION DATE:  5/20 REFERRING MD:  Dr. Francia Greaves, CHIEF COMPLAINT:  septic shock   History of Present Illness:  Patient is a 66 yo M w/ pertinent PMH of HTN, HLD, DMT2, surgical hx of colectomy with ostomy in place present to [facility] on 5/20 for acute encephalpathy.   Patient resides at [facility]. On 5/20 staff state he was found on floor less responsive. Unclear how long patient was on floor. Patient is alert to name, birth, and location but confused to year. Denies any pain. EMS called and patient noted to be hypotensive with SBP 70s. Given IV fluids in route and transported to [facility] ED. Upon arrival patient remains hypotensive and hypothermic 89 F. He is slow to respond but AO. WBC 31. Erythema noted in intertriginous area of groin without drainage. Patient given more IV fluids. Started on cefepime, vanc, and flagyl. Cultures sent. CXR and EKG no significant findings. Pertinent ED labs: Na 126, K 7.5, BUN 146, creat 14.38, Glucose 268, Hgb 13.4, trop 13, LA 7.6, ethanol WNL. PCCM consulted for icu admission  Pertinent  Medical History   Past Medical History:  Diagnosis Date   Hypertension    Significant Hospital Events: Including procedures, antibiotic start and stop dates in addition to other pertinent events   5/20 admitted to ICU with septic shock  Interim History / Subjective:  Overnight developed atrial fibrillation with RVR, amiodarone gtt subsequently started.   Objective   Blood pressure 135/79, pulse 94, temperature 98.9 F (37.2 C), temperature source Axillary, resp. rate 17, height '5\' 6"'$  (1.676 m), weight 103.7 kg, SpO2 94 %.        Intake/Output Summary (Last 24 hours) at 05/09/2022 1021 Last data filed at 05/09/2022 0400 Gross per 24 hour  Intake 2258.25 ml  Output 1250 ml  Net 1008.25 ml    Filed Weights   05/06/22 2115 05/08/22 0304 05/09/22 0406  Weight: 77.1 kg  105.5 kg 103.7 kg   Examination: General: Resting in bed, no acute distress HENT: Normocephalic, atraumatic. Dry mucous membranes. Lungs: Normal respiratory effort on room air. Mild end-expiratory wheezing.  Cardiovascular: Regular rate, rhythm. No murmurs appreciated. Distal pulses 2+ bilaterally. Abdomen: Soft, non-tender, non-distended. Normoactive bowel sounds. Extremities: Warm, dry. No peripheral edema noted. Neuro: Awake, alert. Throughout interview staring at wall. Oriented to person, place. Grossly non-focal.  Assessment & Plan:   #Acute renal failure in setting of hypoperfusion Likely severe dehydration in setting of COVID-19 infection. Continues to have increased urine output, lab work improving. Bicarb up to 30, not hyperkalemic. Per nephrology will d/c bicarb gtt, switch to NS. Nephrology has signed off. Patient stable to be transferred to floor, signed out to [facility].  - Stop bicarb gtt - Start NS 75/hr - Avoid nephrotoxins - Strict I/O  #COVID-19 infection Hemodynamically stable, blood cultures still negative. Continues to be oxygenating well, no role for steroids or antivirals currently. Plan to transfer to floor.  - Hold home antihypertensives - Airborne, contact precautions - Incentive spirometry once encephalopathy improves  #Atrial fibrillation with RVR Yesterday developed new-onset atrial fibrillation, most likely acute in setting of COVID. This morning has spontaneously converted to sinus rhythm. Will d/c amiodarone, no therapeutic anticoagulation needed.  - Stop amiodarone - Hold therapeutic anticoagulation - Daily BMP  #Acute metabolic encephalopathy Mildly improved from yesterday, able to state name and place. More somnolent today, likely due to risperidone. Will decrease  dose of this. - Decrease risperidone 0.'5mg'$  twice daily - Delirium precautions  #End-expiratory wheezing No wheezing this morning, will continue with DuoNebs.  #Type II diabetes  mellitus A1c 6.6%. Sugars at goal elevated after starting tube feeds yesterday. Will start long-acting insulin this morning.  - Start semglee 10u daily - SSI  #L intertriginous candidiasis - Continue nytstatin  Best Practice (right click and "Reselect all SmartList Selections" daily)   Diet/type: NPO DVT prophylaxis: prophylactic heparin  GI prophylaxis: N/A Lines: N/A Foley:  Yes, and it is still needed Code Status:  full code Last date of multidisciplinary goals of care discussion [n/a]  Labs   CBC: Recent Labs  Lab 05/06/22 1915 05/06/22 1955 05/06/22 2130 05/06/22 2221 05/07/22 0109 05/07/22 0329 05/08/22 0030 05/09/22 0634  WBC 31.6*  --  33.5*  --   --  22.3* 9.6 5.7  NEUTROABS 27.9*  --   --   --   --   --   --   --   HGB 13.4   < > 12.8* 12.9* 11.2* 12.0* 10.4* 9.5*  HCT 39.1   < > 37.1* 38.0* 33.0* 32.8* 28.4* 27.2*  MCV 88.7  --  88.5  --   --  83.7 82.3 85.0  PLT 299  --  260  --   --  186 132* 104*   < > = values in this interval not displayed.     Basic Metabolic Panel: Recent Labs  Lab 05/06/22 1915 05/06/22 1955 05/06/22 2130 05/06/22 2221 05/07/22 0018 05/07/22 0109 05/07/22 0329 05/07/22 1001 05/08/22 0030 05/09/22 0634  NA 126* 125*  --    < > 128* 129* 134*  --  135 136  K >7.5* 7.1*  --    < > 6.7* 5.7* 5.1 4.7 3.4* 3.0*  CL 99 103  --   --  100  --  101  --  96* 90*  CO2 <7*  --   --   --  8*  --  11*  --  16* 30  GLUCOSE 268* 231*  --   --  179*  --  130*  --  133* 243*  BUN 146* >130*  --   --  141*  --  143*  --  130* 115*  CREATININE 14.38* 15.60* 13.53*  --  13.59*  --  12.93*  --  10.10* 4.46*  CALCIUM 8.2*  --   --   --  8.5*  --  8.3*  --  7.5* 7.9*  MG  --   --  1.8  --   --   --   --   --  1.4* 2.1  PHOS  --   --  9.0*  --   --   --   --   --   --   --    < > = values in this interval not displayed.    GFR: Estimated Creatinine Clearance: 18.1 mL/min (A) (by C-G formula based on SCr of 4.46 mg/dL (H)). Recent Labs   Lab 05/06/22 1915 05/06/22 2130 05/07/22 0016 05/07/22 0018 05/07/22 0329 05/08/22 0030 05/08/22 0725 05/09/22 0634  PROCALCITON  --  0.65  --  0.80  --  1.68  --   --   WBC 31.6* 33.5*  --   --  22.3* 9.6  --  5.7  LATICACIDVEN 7.6*  --  4.9*  --  3.5*  --  1.6  --      Liver Function Tests:  Recent Labs  Lab 05/06/22 1915  AST 19  ALT 20  ALKPHOS 68  BILITOT 0.6  PROT 6.6  ALBUMIN 3.5    No results for input(s): LIPASE, AMYLASE in the last 168 hours. Recent Labs  Lab 05/06/22 2130  AMMONIA 26     ABG    Component Value Date/Time   PHART 7.293 (L) 05/07/2022 0109   PCO2ART 18.0 (LL) 05/07/2022 0109   PO2ART 102 05/07/2022 0109   HCO3 14.6 (L) 05/07/2022 1001   TCO2 9 (L) 05/07/2022 0109   ACIDBASEDEF 9.6 (H) 05/07/2022 1001   O2SAT 81.9 05/07/2022 1001      Coagulation Profile: Recent Labs  Lab 05/06/22 1915  INR 1.4*     Cardiac Enzymes: Recent Labs  Lab 05/06/22 1915  CKTOTAL 116     HbA1C: Hgb A1c MFr Bld  Date/Time Value Ref Range Status  05/06/2022 09:30 PM 6.6 (H) 4.8 - 5.6 % Final    Comment:    (NOTE) Pre diabetes:          5.7%-6.4%  Diabetes:              >6.4%  Glycemic control for   <7.0% adults with diabetes   03/11/2022 01:08 AM 8.1 (H) 4.8 - 5.6 % Final    Comment:    (NOTE) Pre diabetes:          5.7%-6.4%  Diabetes:              >6.4%  Glycemic control for   <7.0% adults with diabetes     CBG: Recent Labs  Lab 05/08/22 1529 05/08/22 1733 05/08/22 1949 05/08/22 2337 05/09/22 0401  GLUCAP 181* 190* 221* 221* 234*     Critical care time: 25 minutes    Sanjuan Dame, MD Internal Medicine Resident PGY-2 Pager: [phone]

## 2022-05-09 NOTE — Progress Notes (Signed)
Speech Language Pathology Treatment: Dysphagia  Patient Details Name: Erik Turner MRN: [id number] DOB: 10/23/1956 Today's Date: 05/09/2022 Time: 2841-3244 SLP Time Calculation (min) (ACUTE ONLY): 15 min  Assessment / Plan / Recommendation Clinical Impression  Patient seen by SLP for skilled treatment focused on dysphagia goals. When SLP entered room, patient had eyes closed and appeared somewhat fatigued/drowsy but he was able to maintain adequate alertness for PO trials. He verbalized (low intensity voice) in response to SLP questions. SLP observed patient with two small ice chips, and initially he did not exhibit much mastication or oral manipulation, but he started to become more actively participatory with second ice chip. SLP then observed patient with straw sips of thin liquids (water) and patient exhibiting timely swallow initiation. One instance of mildly delayed throat clearing but otherwise no overt s/s aspiration or penetration. SLP informed patient that when he is more alert we can trial some solids. SLP also informed RN of recommendation to continue NPO but allow water sips when patient alert and after oral care.    HPI HPI: 66 y.o. male, resident of [place], admitted 5/20 after being found down, unresponsive. Dx shock in setting of COVID 19 infection, renal failure, encephalopathy. PMHx HTN, HLD, DMT2, surgical hx of colectomy with ostomy.      SLP Plan  Continue with current plan of care      Recommendations for follow up therapy are one component of a multi-disciplinary discharge planning process, led by the attending physician.  Recommendations may be updated based on patient status, additional functional criteria and insurance authorization.    Recommendations  Diet recommendations: NPO;Other(comment) (plain water (straws ok) after oral care and when patient alert, with SLP or RN only) Medication Administration: Via alternative means                Oral Care  Recommendations: Oral care QID;Oral care prior to ice chip/H20 Follow Up Recommendations: Other (comment) (TBD) Assistance recommended at discharge: Frequent or constant Supervision/Assistance SLP Visit Diagnosis: Dysphagia, unspecified (R13.10) Plan: Continue with current plan of care          Sonia Baller, MA, CCC-SLP Speech Therapy

## 2022-05-09 NOTE — Progress Notes (Signed)
SLP Cancellation Note  Patient Details Name: Erik Turner MRN: [id number] DOB: October 01, 1956   Cancelled treatment:       Reason Eval/Treat Not Completed: Patient's level of consciousness. Per RN, patient not alert enough at this time. Plan is for SLP to f/u later this date for patient PO readiness.    Sonia Baller, MA, CCC-SLP Speech Therapy

## 2022-05-09 NOTE — Progress Notes (Signed)
[facility] Progress Note   66 y.o. male with HTN HLD, DM, colectomy with ostomy found down on the floor, seen as a consultation for AKI and hyperkalemia.  The patient lives at [facility]; unclear how long he was on the floor. Hypotension SBP in 70s in field.  Give IV fluid and brought to the ER.  In the ER he was hypotensive to 70s, hypothermic to 89.  The lab showed potassium of 7.5, BUN 146, creatinine level around 15.6, lactic acid 7.6, severe acidotic, WBC 33.5.  He received 3 L of fluid resuscitation and started on broad-spectrum antibiotics with cefepime and vancomycin for septic shock.  CT scan of abdomen pelvis with no trauma or source of sepsis.  CT head with no acute finding however has atrophy and chronic small vessel ischemia.  Home medications include amlodipine, glipizide, losartan, metformin.   Assessment/ Plan:    1) Acute kidney injury in the setting of septic shock, renal hypoperfusion: Patient with severe hyperkalemia, hyponatremia and lactic acidosis.  Received fluid resuscitation in ER.  CT scan without hydronephrosis.  I will check UA.  Since potassium level is improving with medical management and UOP increasing (414m from this am already), I increase IV fluid rate to 100 ml/hr on 5/22. No indication for RRT and renal function improving. Not on pressors but on amiodarone for afib rvr.  Continue strict ins and outs and close monitoring of the lab.   Will stop the HCO3 and convert to NS'@75'$   Signing off at this time; please reconsult as needed.  2) Severe hyperkalemia in the setting of AKI, losartan use.  Received medical treatment including calcium, insulin, dextrose, bicarbonate and Lokelma.  The potassium level improved to 3.4.  Continue medical treatment and monitor lab.   3) Septic shock: Source unknown.  Currently on broad-spectrum antibiotics with vancomycin and cefepime.  CT scan did not show source of infection.  The repeat blood pressure around  90-110s without pressors.     4) Hyponatremia due to dehydration: Sodium level improving with IV fluid.  Monitor lab.   5) Severe metabolic and lactic acidosis: Due to shock and AKI.  Lactic acid level improving with current management.  Increased sodium bicarbonate to 1060mhr (no issues with oxygenation and UOP increasing.  Subjective:   Confused but pleasant on amiodarone started 5/21 for afib; not on pressors. UOP 1700     Objective:   BP (!) 150/70   Pulse 89   Temp 98.9 F (37.2 C) (Axillary)   Resp 20   Ht '5\' 6"'$  (1.676 m)   Wt 103.7 kg   SpO2 94%   BMI 36.90 kg/m   Intake/Output Summary (Last 24 hours) at 05/09/2022 0848 Last data filed at 05/09/2022 0400 Gross per 24 hour  Intake 2453.65 ml  Output 1500 ml  Net 953.65 ml   Weight change: -1.8 kg  Physical Exam: General exam: Confused male lying on bed, not in distress, dry oral mucous membrane. Respiratory system: Clear to auscultation. Respiratory effort normal.  Cardiovascular system: S1 & S2 heard, no pedal edema. Gastrointestinal system: Mild tenderness and firm but no rebound Central nervous system:  alert awake but disoriented Extremities: No edema, no cyanosis Skin: No rashes, lesions or ulcers  Imaging: No results found.  Labs: BMDIRECTVecent Labs  Lab 05/06/22 1915 05/06/22 1955 05/06/22 2130 05/06/22 2221 05/07/22 0018 05/07/22 0109 05/07/22 0329 05/07/22 1001 05/08/22 0030 05/09/22 0634  NA 126* 125*  --  126* 128* 129* 134*  --  135 136  K >7.5* 7.1*  --  6.5* 6.7* 5.7* 5.1 4.7 3.4* 3.0*  CL 99 103  --   --  100  --  101  --  96* 90*  CO2 <7*  --   --   --  8*  --  11*  --  16* 30  GLUCOSE 268* 231*  --   --  179*  --  130*  --  133* 243*  BUN 146* >130*  --   --  141*  --  143*  --  130* 115*  CREATININE 14.38* 15.60* 13.53*  --  13.59*  --  12.93*  --  10.10* 4.46*  CALCIUM 8.2*  --   --   --  8.5*  --  8.3*  --  7.5* 7.9*  PHOS  --   --  9.0*  --   --   --   --   --   --   --     CBC Recent Labs  Lab 05/06/22 1915 05/06/22 1955 05/06/22 2130 05/06/22 2221 05/07/22 0109 05/07/22 0329 05/08/22 0030 05/09/22 0634  WBC 31.6*  --  33.5*  --   --  22.3* 9.6 5.7  NEUTROABS 27.9*  --   --   --   --   --   --   --   HGB 13.4   < > 12.8*   < > 11.2* 12.0* 10.4* 9.5*  HCT 39.1   < > 37.1*   < > 33.0* 32.8* 28.4* 27.2*  MCV 88.7  --  88.5  --   --  83.7 82.3 85.0  PLT 299  --  260  --   --  186 132* 104*   < > = values in this interval not displayed.    Medications:     amiodarone  400 mg Per Tube BID   chlorhexidine  15 mL Mouth Rinse BID   Chlorhexidine Gluconate Cloth  6 each Topical Daily   feeding supplement (PROSource TF)  45 mL Per Tube TID   heparin  5,000 Units Subcutaneous Q8H   insulin aspart  0-15 Units Subcutaneous Q4H   insulin glargine-yfgn  10 Units Subcutaneous Daily   ipratropium-albuterol  3 mL Nebulization TID   mouth rinse  15 mL Mouth Rinse q12n4p   multivitamin with minerals  1 tablet Per Tube Daily   nystatin cream   Topical TID      Otelia Santee, MD 05/09/2022, 8:48 AM

## 2022-05-09 NOTE — TOC Initial Note (Signed)
Transition of Care [facility]) - Initial/Assessment Note    Patient Details  Name: Erik Turner MRN: [id number] Date of Birth: [birth year]  [facility] CM/SW Contact:    Angelita Ingles, RN Phone [phone]  05/09/2022, 4:34 PM  Clinical Narrative:                 TOC acknowledges patient from [facility] and will have needs for disposition when more medically stable. TOC will continue to follow.         Patient Goals and CMS Choice        Expected Discharge Plan and Services                                                Prior Living Arrangements/Services                       Activities of Daily Living      Permission Sought/Granted                  Emotional Assessment              Admission diagnosis:  Hyperkalemia [E87.5] AKI (acute kidney injury) ([place]) [N17.9] Septic shock ([place]) [A41.9, R65.21] Hypotension, unspecified hypotension type [I95.9] Leukocytosis, unspecified type [D72.829] Patient Active Problem List   Diagnosis Date Noted   Pressure injury of skin 05/07/2022   Septic shock ([place]) 05/06/2022   AKI (acute kidney injury) ([place])    Hyperkalemia    Hypotension    Leukocytosis    Hypomagnesemia 03/13/2022   Recurrent falls 03/11/2022   Homeless 03/11/2022   Hypokalemia 86/57/8469   Acute metabolic encephalopathy 62/95/2841   Acute kidney injury superimposed on CKD 3a 03/10/2022   Essential hypertension 03/10/2022   Diabetes mellitus type II, non insulin dependent ([place]) 03/10/2022   BPH (benign prostatic hyperplasia) 03/10/2022   Malignant neoplasm of prostate ([place]) 04/26/2017   PCP:  Merryl Hacker No Pharmacy:   [place], [place] - [address] [place] [address] Phone: [phone] Fax: [phone]  [facility] [address] [place] [address] Phone: [phone] Fax: [phone]     Social Determinants  of Health (SDOH) Interventions    Readmission Risk Interventions     View : No data to display.

## 2022-05-10 ENCOUNTER — Inpatient Hospital Stay (HOSPITAL_COMMUNITY): Payer: Medicare Other

## 2022-05-10 DIAGNOSIS — N179 Acute kidney failure, unspecified: Secondary | ICD-10-CM | POA: Diagnosis not present

## 2022-05-10 DIAGNOSIS — E875 Hyperkalemia: Secondary | ICD-10-CM | POA: Diagnosis not present

## 2022-05-10 DIAGNOSIS — A419 Sepsis, unspecified organism: Secondary | ICD-10-CM | POA: Diagnosis not present

## 2022-05-10 DIAGNOSIS — I4891 Unspecified atrial fibrillation: Secondary | ICD-10-CM | POA: Diagnosis not present

## 2022-05-10 DIAGNOSIS — I959 Hypotension, unspecified: Secondary | ICD-10-CM | POA: Diagnosis not present

## 2022-05-10 LAB — ECHOCARDIOGRAM COMPLETE
AV Mean grad: 8 mmHg
AV Peak grad: 13.2 mmHg
Ao pk vel: 1.82 m/s
Area-P 1/2: 3.53 cm2
Height: 66 in
S' Lateral: 2.8 cm
Single Plane A4C EF: 77.7 %
Weight: 3827.19 oz

## 2022-05-10 LAB — CBC
HCT: 30.3 % — ABNORMAL LOW (ref 39.0–52.0)
Hemoglobin: 10.1 g/dL — ABNORMAL LOW (ref 13.0–17.0)
MCH: 29.8 pg (ref 26.0–34.0)
MCHC: 33.3 g/dL (ref 30.0–36.0)
MCV: 89.4 fL (ref 80.0–100.0)
Platelets: 94 10*3/uL — ABNORMAL LOW (ref 150–400)
RBC: 3.39 MIL/uL — ABNORMAL LOW (ref 4.22–5.81)
RDW: 13.4 % (ref 11.5–15.5)
WBC: 4.9 10*3/uL (ref 4.0–10.5)
nRBC: 0 % (ref 0.0–0.2)

## 2022-05-10 LAB — BASIC METABOLIC PANEL
Anion gap: 11 (ref 5–15)
BUN: 81 mg/dL — ABNORMAL HIGH (ref 8–23)
CO2: 32 mmol/L (ref 22–32)
Calcium: 8.4 mg/dL — ABNORMAL LOW (ref 8.9–10.3)
Chloride: 101 mmol/L (ref 98–111)
Creatinine, Ser: 1.8 mg/dL — ABNORMAL HIGH (ref 0.61–1.24)
GFR, Estimated: 41 mL/min — ABNORMAL LOW (ref >60)
Glucose, Bld: 147 mg/dL — ABNORMAL HIGH (ref 70–99)
Potassium: 3.6 mmol/L (ref 3.5–5.1)
Sodium: 144 mmol/L (ref 135–145)

## 2022-05-10 LAB — MAGNESIUM: Magnesium: 1.8 mg/dL (ref 1.7–2.4)

## 2022-05-10 LAB — GLUCOSE, CAPILLARY
Glucose-Capillary: 128 mg/dL — ABNORMAL HIGH (ref 70–99)
Glucose-Capillary: 133 mg/dL — ABNORMAL HIGH (ref 70–99)
Glucose-Capillary: 134 mg/dL — ABNORMAL HIGH (ref 70–99)
Glucose-Capillary: 144 mg/dL — ABNORMAL HIGH (ref 70–99)
Glucose-Capillary: 149 mg/dL — ABNORMAL HIGH (ref 70–99)
Glucose-Capillary: 150 mg/dL — ABNORMAL HIGH (ref 70–99)
Glucose-Capillary: 155 mg/dL — ABNORMAL HIGH (ref 70–99)

## 2022-05-10 MED ORDER — PERFLUTREN LIPID MICROSPHERE
1.0000 mL | INTRAVENOUS | Status: AC | PRN
  Administered 2022-05-10: 2 mL via INTRAVENOUS

## 2022-05-10 MED ORDER — QUETIAPINE FUMARATE 25 MG PO TABS
25.0000 mg | ORAL_TABLET | Freq: Every day | ORAL | Status: DC
  Administered 2022-05-10 – 2022-05-12 (×3): 25 mg via ORAL
  Filled 2022-05-10 (×3): qty 1

## 2022-05-10 MED ORDER — HALOPERIDOL LACTATE 5 MG/ML IJ SOLN: 2.0000 mg | Freq: Four times a day (QID) | INTRAMUSCULAR | Status: DC | PRN

## 2022-05-10 MED ORDER — LEVALBUTEROL HCL 0.63 MG/3ML IN NEBU
0.6300 mg | INHALATION_SOLUTION | RESPIRATORY_TRACT | Status: DC | PRN
  Administered 2022-05-10: 0.63 mg via RESPIRATORY_TRACT
  Filled 2022-05-10 (×2): qty 3

## 2022-05-10 MED ORDER — MELATONIN 3 MG PO TABS
3.0000 mg | ORAL_TABLET | Freq: Every day | ORAL | Status: DC
  Administered 2022-05-10 – 2022-05-12 (×3): 3 mg via ORAL
  Filled 2022-05-10 (×3): qty 1

## 2022-05-10 NOTE — Progress Notes (Signed)
Speech Language Pathology Treatment: Dysphagia  Patient Details Name: Erik Turner MRN: [id number] DOB: 10-15-1956 Today's Date: 05/10/2022 Time: 1545-1600 SLP Time Calculation (min) (ACUTE ONLY): 15 min  Assessment / Plan / Recommendation Clinical Impression  Patient seen by SLP for skilled treatment focused on dysphagia goals. Patient was more awake and alert today as compared to previous date, and he was more verbal with vocal intensity stronger and patient interacting more, asking SLP how long he had been asleep. SLP observed patient's PO intake of thin liquids (water) and gelatin. No overt s/s aspiration or penetration with liquids or gelatin solids  and patient's vocal quality and vitals did not change. At this time, SLP recommending to start PO diet of full liquids (thin consistency) with full supervision when patient alert. SLP will continue to follow patient for PO toleration and ability to upgrade solids.    HPI HPI: 66 y.o. male, resident of [place], admitted 5/20 after being found down, unresponsive. Dx shock in setting of COVID 19 infection, renal failure, encephalopathy. PMHx HTN, HLD, DMT2, surgical hx of colectomy with ostomy.      SLP Plan  Continue with current plan of care      Recommendations for follow up therapy are one component of a multi-disciplinary discharge planning process, led by the attending physician.  Recommendations may be updated based on patient status, additional functional criteria and insurance authorization.    Recommendations  Diet recommendations: Thin liquid;Other(comment) (full liquids) Liquids provided via: Straw;Cup Medication Administration: Via alternative means Supervision: Full supervision/cueing for compensatory strategies;Staff to assist with self feeding Compensations: Minimize environmental distractions;Small sips/bites;Slow rate Postural Changes and/or Swallow Maneuvers: Seated upright 90 degrees                Oral  Care Recommendations: Oral care QID;Staff/trained caregiver to provide oral care Follow Up Recommendations: Other (comment) (TBD) Assistance recommended at discharge: Frequent or constant Supervision/Assistance SLP Visit Diagnosis: Dysphagia, unspecified (R13.10) Plan: Continue with current plan of care           Sonia Baller, MA, CCC-SLP Speech Therapy

## 2022-05-10 NOTE — Progress Notes (Addendum)
PROGRESS NOTE        PATIENT DETAILS Name: Erik Turner Age: 66 y.o. Sex: male Date of Birth: [birth year] Admit Date: 05/06/2022 Admitting Physician Laurin Coder, MD PCP:Pcp, No  Brief Summary: Patient is a 66 y.o.  male with history of HTN, HLD, DM-2, s/p colectomy-who was found on the floor at his SNF-he was hypotensive-he was found to have AKI with a creatinine of around 14-he was subsequently admitted to the [facility] with supportive care-and upon further improvement-transferred to [facility] service.   Significant events: 5/20>> hypotensive/encephalopathic-AKI-to ICU. 5/24>> transfer to [facility]  Significant studies: 5/20>> CT head: No acute intracranial abnormality. 5/20>>CT C-spine: Osteopenia/degenerative changes-no fracture/malalignment 5/20>> CT abdomen/pelvis: No acute findings.  Significant microbiology data: 5/20>> blood culture: No growth 5/20>> urine culture: No growth 5/20>> COVID PCR: Positive  Procedures: None  Consults: Renal, PCCM  Subjective: Appears weak-frail-but seems to be much more awake today than what was described in the chart yesterday.  Follow simple commands.  Per nursing staff-pulled out his NG tube earlier this morning.  Objective: Vitals: Blood pressure (!) 146/79, pulse 86, temperature 97.8 F (36.6 C), temperature source Axillary, resp. rate 20, height '5\' 6"'$  (1.676 m), weight 108.5 kg, SpO2 94 %.   Exam: Gen Exam:Alert awake-not in any distress.  Looks frail and chronically sick appearing. HEENT:atraumatic, normocephalic Chest: B/L clear to auscultation anteriorly CVS:S1S2 regular Abdomen:soft non tender, non distended.  Ostomy in place. Extremities:no edema Neurology: Non focal Skin: no rash  Pertinent Labs/Radiology:    Latest Ref Rng & Units 05/10/2022    8:06 AM 05/09/2022    6:34 AM 05/08/2022   12:30 AM  CBC  WBC 4.0 - 10.5 K/uL 4.9   5.7   9.6    Hemoglobin 13.0 - 17.0 g/dL 10.1   9.5   10.4     Hematocrit 39.0 - 52.0 % 30.3   27.2   28.4    Platelets 150 - 400 K/uL 94   104   132      Lab Results  Component Value Date   NA 144 05/10/2022   K 3.6 05/10/2022   CL 101 05/10/2022   CO2 32 05/10/2022      Assessment/Plan: AKI: Hemodynamically mediated-likely ATN in the setting of hypotension/COVID-19 infection.  Managed with supportive care-creatinine has improved significantly.  Continue to avoid nephrotoxic agents.  Severe hyperkalemia: In the setting of AKI/losartan use-resolved.  Severe metabolic acidosis: Due to AKI-resolved-managed with bicarb infusion.  Hyponatremia: Due to dehydration-improved.  Hypotension-due to combination of hypovolemia and septic shock from COVID-19 infection: Resolved-all cultures negative to date.  BP stable.  Acute metabolic encephalopathy: Due to AKI/COVID-19 infection-gradually improving-continue supportive care.  Dysphagia: In the setting of encephalopathy-patient pulled out his NG tube today-plan is to await SLP evaluation-if he is to remain n.p.o.-we will need NG tube reinserted.  Discussed with RN earlier this morning.  PAF with RVR: Very brief-while in the ICU-briefly managed with amiodarone infusion.  Since it is very brief and in the setting of acute illness-not started on anticoagulation.  Checking echocardiogram.  TSH within normal limits on 5/20.  Thrombocytopenia: Watch closely-probably due to COVID-19 infection-sepsis physiology-if it continues to drop-May need to initiate further work-up.  Normocytic anemia: Due to acute illness-follow CBC  DM-2 (A1c 6.6 on 5/20): CBGs stable-continue Semglee 10 units daily and SSI.  Recent Labs  05/10/22 0435 05/10/22 0930 05/10/22 1301  GLUCAP 128* 133* 149*    HLD: Statin over the next few days.  HTN: BP slowly creeping up-but relatively stable-continue to hold amlodipine/losartan.   History of colostomy  Nutrition Status: Nutrition Problem: Increased nutrient  needs Etiology: acute illness (COVID-19) Signs/Symptoms: estimated needs Interventions: Tube feeding, MVI    Pressure Ulcer: Pressure Injury 05/06/22 Buttocks Right Stage 1 -  Intact skin with non-blanchable redness of a localized area usually over a bony prominence. (Active)  05/06/22   Location: Buttocks  Location Orientation: Right  Staging: Stage 1 -  Intact skin with non-blanchable redness of a localized area usually over a bony prominence.  Wound Description (Comments):   Present on Admission: Yes  Dressing Type Foam - Lift dressing to assess site every shift 05/09/22 0800     Pressure Injury 05/07/22 Thigh Right;Lateral Stage 1 -  Intact skin with non-blanchable redness of a localized area usually over a bony prominence. (Active)  05/07/22   Location: Thigh  Location Orientation: Right;Lateral  Staging: Stage 1 -  Intact skin with non-blanchable redness of a localized area usually over a bony prominence.  Wound Description (Comments):   Present on Admission: Yes  Dressing Type Foam - Lift dressing to assess site every shift 05/09/22 0800     Pressure Injury 05/06/22 Buttocks Right;Medial Stage 1 -  Intact skin with non-blanchable redness of a localized area usually over a bony prominence. (Active)  05/06/22   Location: Buttocks  Location Orientation: Right;Medial  Staging: Stage 1 -  Intact skin with non-blanchable redness of a localized area usually over a bony prominence.  Wound Description (Comments):   Present on Admission: Yes  Dressing Type Foam - Lift dressing to assess site every shift 05/09/22 0800     Pressure Injury 05/06/22 Buttocks Right;Lateral Stage 1 -  Intact skin with non-blanchable redness of a localized area usually over a bony prominence. (Active)  05/06/22   Location: Buttocks  Location Orientation: Right;Lateral  Staging: Stage 1 -  Intact skin with non-blanchable redness of a localized area usually over a bony prominence.  Wound Description  (Comments):   Present on Admission: Yes  Dressing Type Foam - Lift dressing to assess site every shift 05/08/22 1600     Pressure Injury 02/06/22 Flank Right;Lateral Stage 1 -  Intact skin with non-blanchable redness of a localized area usually over a bony prominence. (Active)  02/06/22   Location: Flank  Location Orientation: Right;Lateral  Staging: Stage 1 -  Intact skin with non-blanchable redness of a localized area usually over a bony prominence.  Wound Description (Comments):   Present on Admission: Yes  Dressing Type None 05/08/22 1600   Obesity: Estimated body mass index is 38.61 kg/m as calculated from the following:   Height as of this encounter: '5\' 6"'$  (1.676 m).   Weight as of this encounter: 108.5 kg.   Code status:   Code Status: Full Code   DVT Prophylaxis: heparin injection 5,000 Units Start: 05/06/22 2200   Family Communication: None at bedside   Disposition Plan: Status is: Inpatient Remains inpatient appropriate because:    Planned Discharge Destination:Skilled nursing facility   Diet: Diet Order             Diet NPO time specified  Diet effective now                     Antimicrobial agents: Anti-infectives (From admission, onward)    Start  Dose/Rate Route Frequency Ordered Stop   05/07/22 2200  ceFEPIme (MAXIPIME) 1 g in sodium chloride 0.9 % 100 mL IVPB  Status:  Discontinued        1 g 200 mL/hr over 30 Minutes Intravenous Every 24 hours 05/06/22 2117 05/08/22 0944   05/06/22 2117  vancomycin variable dose per unstable renal function (pharmacist dosing)  Status:  Discontinued         Does not apply See admin instructions 05/06/22 2117 05/08/22 0944   05/06/22 1915  metroNIDAZOLE (FLAGYL) IVPB 500 mg        500 mg 100 mL/hr over 60 Minutes Intravenous  Once 05/06/22 1904 05/06/22 2031   05/06/22 1915  vancomycin (VANCOREADY) IVPB 1750 mg/350 mL        1,750 mg 175 mL/hr over 120 Minutes Intravenous  Once 05/06/22 1912 05/06/22  2236   05/06/22 1915  ceFEPIme (MAXIPIME) 2 g in sodium chloride 0.9 % 100 mL IVPB        2 g 200 mL/hr over 30 Minutes Intravenous  Once 05/06/22 1912 05/06/22 2031        MEDICATIONS: Scheduled Meds:  chlorhexidine  15 mL Mouth Rinse BID   Chlorhexidine Gluconate Cloth  6 each Topical Daily   feeding supplement (PROSource TF)  45 mL Per Tube TID   heparin  5,000 Units Subcutaneous Q8H   insulin aspart  0-15 Units Subcutaneous Q4H   insulin glargine-yfgn  10 Units Subcutaneous Daily   mouth rinse  15 mL Mouth Rinse q12n4p   multivitamin with minerals  1 tablet Per Tube Daily   nystatin cream   Topical TID   Continuous Infusions:  sodium chloride     sodium chloride 75 mL/hr at 05/10/22 0535   feeding supplement (OSMOLITE 1.2 CAL) 1,000 mL (05/10/22 0535)   PRN Meds:.acetaminophen, docusate sodium, polyethylene glycol, risperiDONE   I have personally reviewed following labs and imaging studies  LABORATORY DATA: CBC: Recent Labs  Lab 05/06/22 1915 05/06/22 1955 05/06/22 2130 05/06/22 2221 05/07/22 0109 05/07/22 0329 05/08/22 0030 05/09/22 0634 05/10/22 0806  WBC 31.6*  --  33.5*  --   --  22.3* 9.6 5.7 4.9  NEUTROABS 27.9*  --   --   --   --   --   --   --   --   HGB 13.4   < > 12.8*   < > 11.2* 12.0* 10.4* 9.5* 10.1*  HCT 39.1   < > 37.1*   < > 33.0* 32.8* 28.4* 27.2* 30.3*  MCV 88.7  --  88.5  --   --  83.7 82.3 85.0 89.4  PLT 299  --  260  --   --  186 132* 104* 94*   < > = values in this interval not displayed.    Basic Metabolic Panel: Recent Labs  Lab 05/06/22 2130 05/06/22 2221 05/07/22 0329 05/07/22 1001 05/08/22 0030 05/09/22 0634 05/09/22 1631 05/10/22 0806  NA  --    < > 134*  --  135 136 139 144  K  --    < > 5.1 4.7 3.4* 3.0* 3.4* 3.6  CL  --    < > 101  --  96* 90* 93* 101  CO2  --    < > 11*  --  16* 30 32 32  GLUCOSE  --    < > 130*  --  133* 243* 252* 147*  BUN  --    < > 143*  --  130* 115* 106* 81*  CREATININE 13.53*   < > 12.93*   --  10.10* 4.46* 3.18* 1.80*  CALCIUM  --    < > 8.3*  --  7.5* 7.9* 8.2* 8.4*  MG 1.8  --   --   --  1.4* 2.1  --  1.8  PHOS 9.0*  --   --   --   --   --   --   --    < > = values in this interval not displayed.    GFR: Estimated Creatinine Clearance: 46 mL/min (A) (by C-G formula based on SCr of 1.8 mg/dL (H)).  Liver Function Tests: Recent Labs  Lab 05/06/22 1915  AST 19  ALT 20  ALKPHOS 68  BILITOT 0.6  PROT 6.6  ALBUMIN 3.5   No results for input(s): LIPASE, AMYLASE in the last 168 hours. Recent Labs  Lab 05/06/22 2130  AMMONIA 26    Coagulation Profile: Recent Labs  Lab 05/06/22 1915  INR 1.4*    Cardiac Enzymes: Recent Labs  Lab 05/06/22 1915  CKTOTAL 116    BNP (last 3 results) No results for input(s): PROBNP in the last 8760 hours.  Lipid Profile: No results for input(s): CHOL, HDL, LDLCALC, TRIG, CHOLHDL, LDLDIRECT in the last 72 hours.  Thyroid Function Tests: No results for input(s): TSH, T4TOTAL, FREET4, T3FREE, THYROIDAB in the last 72 hours.  Anemia Panel: No results for input(s): VITAMINB12, FOLATE, FERRITIN, TIBC, IRON, RETICCTPCT in the last 72 hours.  Urine analysis:    Component Value Date/Time   COLORURINE AMBER (A) 05/07/2022 0916   APPEARANCEUR CLOUDY (A) 05/07/2022 0916   LABSPEC 1.018 05/07/2022 0916   PHURINE 5.0 05/07/2022 0916   GLUCOSEU NEGATIVE 05/07/2022 0916   HGBUR LARGE (A) 05/07/2022 0916   BILIRUBINUR NEGATIVE 05/07/2022 0916   KETONESUR NEGATIVE 05/07/2022 0916   PROTEINUR 100 (A) 05/07/2022 0916   NITRITE NEGATIVE 05/07/2022 0916   LEUKOCYTESUR LARGE (A) 05/07/2022 0916    Sepsis Labs: Lactic Acid, Venous    Component Value Date/Time   LATICACIDVEN 1.6 05/08/2022 0725    MICROBIOLOGY: Recent Results (from the past 240 hour(s))  Resp Panel by RT-PCR (Flu A&B, Covid) Nasopharyngeal Swab     Status: Abnormal   Collection Time: 05/06/22  7:04 PM   Specimen: Nasopharyngeal Swab; Nasopharyngeal(NP) swabs  in vial transport medium  Result Value Ref Range Status   SARS Coronavirus 2 by RT PCR POSITIVE (A) NEGATIVE Final    Comment: (NOTE) SARS-CoV-2 target nucleic acids are DETECTED.  The SARS-CoV-2 RNA is generally detectable in upper respiratory specimens during the acute phase of infection. Positive results are indicative of the presence of the identified virus, but do not rule out bacterial infection or co-infection with other pathogens not detected by the test. Clinical correlation with patient history and other diagnostic information is necessary to determine patient infection status. The expected result is Negative.  Fact Sheet for Patients: EntrepreneurPulse.com.au  Fact Sheet for Healthcare Providers: IncredibleEmployment.be  This test is not yet approved or cleared by the [place] FDA and  has been authorized for detection and/or diagnosis of SARS-CoV-2 by FDA under an Emergency Use Authorization (EUA).  This EUA will remain in effect (meaning this test can be used) for the duration of  the COVID-19 declaration under Section 564(b)(1) of the A ct, 21 U.S.C. section 360bbb-3(b)(1), unless the authorization is terminated or revoked sooner.     Influenza A by PCR NEGATIVE NEGATIVE Final  Influenza B by PCR NEGATIVE NEGATIVE Final    Comment: (NOTE) The Xpert Xpress SARS-CoV-2/FLU/RSV plus assay is intended as an aid in the diagnosis of influenza from Nasopharyngeal swab specimens and should not be used as a sole basis for treatment. Nasal washings and aspirates are unacceptable for Xpert Xpress SARS-CoV-2/FLU/RSV testing.  Fact Sheet for Patients: EntrepreneurPulse.com.au  Fact Sheet for Healthcare Providers: IncredibleEmployment.be  This test is not yet approved or cleared by the [place] FDA and has been authorized for detection and/or diagnosis of SARS-CoV-2 by FDA under an Emergency  Use Authorization (EUA). This EUA will remain in effect (meaning this test can be used) for the duration of the COVID-19 declaration under Section 564(b)(1) of the Act, 21 U.S.C. section 360bbb-3(b)(1), unless the authorization is terminated or revoked.  Performed at [facility], [place] [address], [place], [place] [address]   Culture, blood (routine x 2)     Status: None (Preliminary result)   Collection Time: 05/06/22  7:13 PM   Specimen: BLOOD RIGHT WRIST  Result Value Ref Range Status   Specimen Description BLOOD RIGHT WRIST  Final   Special Requests   Final    BOTTLES DRAWN AEROBIC AND ANAEROBIC Blood Culture results may not be optimal due to an inadequate volume of blood received in culture bottles   Culture   Final    NO GROWTH 4 DAYS Performed at [facility], [place] [address] [place], [place] [address]    Report Status PENDING  Incomplete  Culture, blood (routine x 2)     Status: None (Preliminary result)   Collection Time: 05/06/22  7:15 PM   Specimen: Left Antecubital; Blood  Result Value Ref Range Status   Specimen Description LEFT ANTECUBITAL  Final   Special Requests   Final    BOTTLES DRAWN AEROBIC AND ANAEROBIC Blood Culture adequate volume   Culture   Final    NO GROWTH 4 DAYS Performed at [facility], [place] [address], [place], [place] [address]    Report Status PENDING  Incomplete  Urine Culture     Status: None   Collection Time: 05/06/22  8:58 PM   Specimen: Urine, Clean Catch  Result Value Ref Range Status   Specimen Description URINE, CLEAN CATCH  Final   Special Requests NONE  Final   Culture   Final    NO GROWTH Performed at [facility], [place] [address], [place], [place] [address]    Report Status 05/08/2022 FINAL  Final  MRSA Next Gen by PCR, Nasal     Status: None   Collection Time: 05/06/22 11:16 PM   Specimen: Nasal Mucosa; Nasal Swab  Result Value Ref Range Status   MRSA by PCR Next Gen NOT DETECTED NOT DETECTED Final     Comment: (NOTE) The GeneXpert MRSA Assay (FDA approved for NASAL specimens only), is one component of a comprehensive MRSA colonization surveillance program. It is not intended to diagnose MRSA infection nor to guide or monitor treatment for MRSA infections. Test performance is not FDA approved in patients less than 59 years old. Performed at [facility], [place] [address], [place],  [address]     RADIOLOGY STUDIES/RESULTS: DG Abd 1 View  Result Date: 05/09/2022 CLINICAL DATA:  NG tube placement EXAM: ABDOMEN - 1 VIEW COMPARISON:  05/07/2022 FINDINGS: Enteric tube tip is present in the upper mid abdomen consistent with location in the distal stomach. Paucity of gas in the visualized  upper abdomen. IMPRESSION: Enteric tube tip is in the upper abdomen consistent with location in the distal stomach. Electronically Signed   By: Lucienne Capers M.D.   On: 05/09/2022 21:50     LOS: 4 days   Oren Binet, MD  [facility]    To contact the attending provider between 7A-7P or the covering provider during after hours 7P-7A, please log into the web site [email or web address] and access using universal [place] password for that web site. If you do not have the password, please call the hospital operator.  05/10/2022, 1:36 PM

## 2022-05-10 NOTE — Procedures (Signed)
Cortrak  Person Inserting Tube:  Saniah Schroeter C, RD Tube Type:  Cortrak - 43 inches Tube Size:  10 Tube Location:  Left nare Secured by: Bridle Technique Used to Measure Tube Placement:  Marking at nare/corner of mouth Cortrak Secured At:  70 cm   Cortrak Tube Team Note:  Consult received to place a Cortrak feeding tube.   X-ray is required, abdominal x-ray has been ordered by the Cortrak team. Please confirm tube placement before using the Cortrak tube.   If the tube becomes dislodged please keep the tube and contact the Cortrak team at [email or web address] (password [email or web address]) for replacement.  If after hours and replacement cannot be delayed, place a NG tube and confirm placement with an abdominal x-ray.    Nakaila Freeze P., RD, LDN, CNSC See AMiON for contact information    

## 2022-05-11 DIAGNOSIS — A419 Sepsis, unspecified organism: Secondary | ICD-10-CM | POA: Diagnosis not present

## 2022-05-11 DIAGNOSIS — N179 Acute kidney failure, unspecified: Secondary | ICD-10-CM | POA: Diagnosis not present

## 2022-05-11 DIAGNOSIS — I959 Hypotension, unspecified: Secondary | ICD-10-CM | POA: Diagnosis not present

## 2022-05-11 DIAGNOSIS — E875 Hyperkalemia: Secondary | ICD-10-CM | POA: Diagnosis not present

## 2022-05-11 LAB — COMPREHENSIVE METABOLIC PANEL
ALT: 20 U/L (ref 0–44)
AST: 36 U/L (ref 15–41)
Albumin: 2.5 g/dL — ABNORMAL LOW (ref 3.5–5.0)
Alkaline Phosphatase: 41 U/L (ref 38–126)
Anion gap: 5 (ref 5–15)
BUN: 55 mg/dL — ABNORMAL HIGH (ref 8–23)
CO2: 37 mmol/L — ABNORMAL HIGH (ref 22–32)
Calcium: 8.6 mg/dL — ABNORMAL LOW (ref 8.9–10.3)
Chloride: 106 mmol/L (ref 98–111)
Creatinine, Ser: 1.29 mg/dL — ABNORMAL HIGH (ref 0.61–1.24)
GFR, Estimated: >60 mL/min (ref >60)
Glucose, Bld: 140 mg/dL — ABNORMAL HIGH (ref 70–99)
Potassium: 3.6 mmol/L (ref 3.5–5.1)
Sodium: 148 mmol/L — ABNORMAL HIGH (ref 135–145)
Total Bilirubin: 0.5 mg/dL (ref 0.3–1.2)
Total Protein: 5.4 g/dL — ABNORMAL LOW (ref 6.5–8.1)

## 2022-05-11 LAB — CULTURE, BLOOD (ROUTINE X 2)
Culture: NO GROWTH
Culture: NO GROWTH
Special Requests: ADEQUATE

## 2022-05-11 LAB — CBC
HCT: 30.8 % — ABNORMAL LOW (ref 39.0–52.0)
Hemoglobin: 9.8 g/dL — ABNORMAL LOW (ref 13.0–17.0)
MCH: 29.3 pg (ref 26.0–34.0)
MCHC: 31.8 g/dL (ref 30.0–36.0)
MCV: 91.9 fL (ref 80.0–100.0)
Platelets: 108 10*3/uL — ABNORMAL LOW (ref 150–400)
RBC: 3.35 MIL/uL — ABNORMAL LOW (ref 4.22–5.81)
RDW: 13.4 % (ref 11.5–15.5)
WBC: 4.1 10*3/uL (ref 4.0–10.5)
nRBC: 0 % (ref 0.0–0.2)

## 2022-05-11 LAB — GLUCOSE, CAPILLARY
Glucose-Capillary: 131 mg/dL — ABNORMAL HIGH (ref 70–99)
Glucose-Capillary: 151 mg/dL — ABNORMAL HIGH (ref 70–99)
Glucose-Capillary: 142 mg/dL — ABNORMAL HIGH (ref 70–99)
Glucose-Capillary: 185 mg/dL — ABNORMAL HIGH (ref 70–99)
Glucose-Capillary: 116 mg/dL — ABNORMAL HIGH (ref 70–99)

## 2022-05-11 MED ORDER — METOPROLOL TARTRATE 25 MG PO TABS
25.0000 mg | ORAL_TABLET | Freq: Two times a day (BID) | ORAL | Status: DC
  Administered 2022-05-11 – 2022-05-13 (×6): 25 mg via ORAL
  Filled 2022-05-11 (×7): qty 1

## 2022-05-11 MED ORDER — ENSURE ENLIVE PO LIQD
237.0000 mL | Freq: Three times a day (TID) | ORAL | Status: DC
  Administered 2022-05-12 – 2022-05-13 (×6): 237 mL via ORAL

## 2022-05-11 MED ORDER — FREE WATER
200.0000 mL | Freq: Three times a day (TID) | Status: DC
  Administered 2022-05-11 – 2022-05-12 (×5): 200 mL

## 2022-05-11 NOTE — Progress Notes (Signed)
Nutrition Follow-up  DOCUMENTATION CODES:   Not applicable  INTERVENTION:   Tube feeding via Cortrak: Osmolite 1.2 at 70 mL/hr (1680 mL/day) 45 mL ProSource TF - TID Provides 2136 kcal, 126 gm protein, 1378 mL free water daily. MVI with minerals via tube once daily. Ensure Enlive po TID, each supplement provides 350 kcal and 20 grams of protein. Feeding assist with all meals  NUTRITION DIAGNOSIS:   Increased nutrient needs related to acute illness (COVID-19) as evidenced by estimated needs. - Ongoing  GOAL:   Patient will meet greater than or equal to 90% of their needs - Being met via TF  MONITOR:   PO intake, Supplement acceptance, Diet advancement, Labs, TF tolerance, Weight trends, Skin  REASON FOR ASSESSMENT:   Consult Enteral/tube feeding initiation and management  ASSESSMENT:   66 yo male admitted with acute renal failure after being found down at his SNF. Tested positive for COVID-19. PMH includes HTN, HLD, DM-2, colectomy, ileostomy, former smoker.  5/24 - diet advanced to full liquids; Cortrak placed (tip gastric)  Pt resting in bed, NT feeding pt at time of RD visit. Pt ate 100% of his tray.  Pt reports that he was not eating well at his SNF due to no one feeding him.  Pt denies any intolerance to his tube feed at this time. Discussed the use of tube feeds until pt diet is advanced further to provide additional nutrition; pt expressed understanding.   Pt denies any recent weight loss.   Discussed ONS with pt, pt agreeable to Ensure and requesting 3 per day. RD to order.   Medications reviewed and include: SSI 0-15 units q4h, Semglee, MVI Labs reviewed: Sodium 148, BUN 55, Creatinine 1.29, 24 hr CBG 131-155  NUTRITION - FOCUSED PHYSICAL EXAM:   Diet Order:   Diet Order             Diet full liquid Room service appropriate? No; Fluid consistency: Thin  Diet effective now                  EDUCATION NEEDS:   No education needs have been  identified at this time  Skin:  Skin Assessment: Skin Integrity Issues: Skin Integrity Issues:: Stage I, Other (Comment) Stage I: R buttocks x 3, R flank, R thigh Other: L arm wound from IV infiltration  Last BM:  5/24 - 350 mL via ileostomy  Height:  Ht Readings from Last 1 Encounters:  05/08/22 5' 6"  (1.676 m)   Weight:  Wt Readings from Last 1 Encounters:  05/11/22 106.3 kg   Ideal Body Weight:  64.5 kg  BMI:  Body mass index is 37.82 kg/m.  Estimated Nutritional Needs:  Kcal:  2000-2200 Protein:  115-130 gm Fluid:  >/= 2 L   Hermina Barters RD, LDN Clinical Dietitian See [facility] for contact information.

## 2022-05-11 NOTE — Evaluation (Signed)
Occupational Therapy Evaluation Patient Details Name: Erik Turner MRN: [id number] DOB: 10-30-1956 Today's Date: 05/11/2022   History of Present Illness 66 yo M present to [facility] on 5/20 for acute encephalpathy after found on the floor. Resides at [facility]. 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 He presents with generalized weakness, decreased balance and impaired cognition. He needs min to total assist for ADLs, mod assist for bed mobility and min assist for OOB with RW. VSS throughout session. Will follow acutely. Recommend return to SNF upon discharge.     Recommendations for follow up therapy are one component of a multi-disciplinary discharge planning process, led by the attending physician.  Recommendations may be updated based on patient status, additional functional criteria and insurance authorization.   Follow Up Recommendations  Long-term institutional care without follow-up therapy    Assistance Recommended at Discharge Frequent or constant Supervision/Assistance  Patient can return home with the following A little help with walking and/or transfers;A lot of help with bathing/dressing/bathroom;Direct supervision/assist for medications management;Direct supervision/assist for financial management;Assist for transportation;Help with stairs or ramp for entrance    Functional Status Assessment  Patient has had a recent decline in their functional status and/or demonstrates limited ability to make significant improvements in function in a reasonable and predictable amount of time  Equipment Recommendations  None recommended by OT    Recommendations for Other Services       Precautions / Restrictions Precautions Precautions: Fall Precaution Comments: cortrak      Mobility Bed Mobility Overal bed mobility: Needs Assistance Bed Mobility:  Supine to Sit     Supine to sit: Mod assist, HOB elevated     General bed mobility comments: assist to elevate trunk, pt then able to scoot out to EOB with supervsion    Transfers Overall transfer level: Needs assistance Equipment used: Rolling walker (2 wheels) Transfers: Sit to/from Stand Sit to Stand: Min assist, +2 safety/equipment           General transfer comment: poor use of RW (grabs handles to pull up); +2 for telemetry, IV, feeding tube      Balance Overall balance assessment: Needs assistance, History of Falls Sitting-balance support: No upper extremity supported, Feet supported Sitting balance-Leahy Scale: Poor Sitting balance - Comments: initially with posterior lean; progressed to fair with time   Standing balance support: Bilateral upper extremity supported Standing balance-Leahy Scale: Poor Standing balance comment: poor dynamic balance, requiring RW, pt seeming familiar with use                           ADL either performed or assessed with clinical judgement   ADL Overall ADL's : Needs assistance/impaired Eating/Feeding: Minimal assistance;Sitting   Grooming: Wash/dry hands;Sitting;Set up   Upper Body Bathing: Moderate assistance;Sitting   Lower Body Bathing: Maximal assistance;Sit to/from stand   Upper Body Dressing : Minimal assistance;Sitting   Lower Body Dressing: Total assistance;Bed level   Toilet Transfer: Minimal assistance;Ambulation;Rolling walker (2 wheels)     Toileting - Clothing Manipulation Details (indicate cue type and reason): pt with colostomy and foley     Functional mobility during ADLs: Minimal assistance;+2 for safety/equipment       Vision Ability to See in Adequate Light: 0 Adequate       Perception     Praxis  Pertinent Vitals/Pain Pain Assessment Pain Assessment: No/denies pain     Hand Dominance Right   Extremity/Trunk Assessment Upper Extremity Assessment Upper Extremity  Assessment: Generalized weakness;RUE deficits/detail;LUE deficits/detail RUE Deficits / Details: limited shoulder ROM, mild tremor RUE Coordination: decreased gross motor LUE Deficits / Details: limited shoulder ROM LUE Coordination: decreased gross motor   Lower Extremity Assessment Lower Extremity Assessment: Defer to PT evaluation   Cervical / Trunk Assessment Cervical / Trunk Assessment: Normal   Communication Communication Communication: No difficulties   Cognition Arousal/Alertness: Awake/alert Behavior During Therapy: Flat affect Overall Cognitive Status: No family/caregiver present to determine baseline cognitive functioning                                 General Comments: following simple commands; oriented to self only     General Comments  VSS on telemetry and pulse ox on RA    Exercises     Shoulder Instructions      Home Living Family/patient expects to be discharged to:: Skilled nursing facility                                 Additional Comments: resident, long-term care      Prior Functioning/Environment Prior Level of Function : Patient poor historian/Family not available             Mobility Comments: appeared familiar with use of RW and per chart used one previously          OT Problem List: Decreased strength;Impaired balance (sitting and/or standing);Decreased cognition;Decreased safety awareness;Decreased knowledge of use of DME or AE;Impaired UE functional use      OT Treatment/Interventions: Self-care/ADL training;DME and/or AE instruction;Therapeutic activities;Patient/family education;Balance training    OT Goals(Current goals can be found in the care plan section) Acute Rehab OT Goals OT Goal Formulation: Patient unable to participate in goal setting Time For Goal Achievement: 05/25/22 Potential to Achieve Goals: Good ADL Goals Pt Will Perform Eating: Independently;sitting Pt Will Perform Grooming:  with min guard assist;standing Pt Will Perform Upper Body Dressing: with set-up;sitting Pt Will Transfer to Toilet: with min guard assist;ambulating;bedside commode Pt Will Perform Toileting - Clothing Manipulation and hygiene: with min assist Additional ADL Goal #1: Pt will perform bed mobility with minimum assistance in preparation for ADLs.  OT Frequency: Min 2X/week    Co-evaluation   Reason for Co-Treatment: For patient/therapist safety;To address functional/ADL transfers          AM-PAC OT "6 Clicks" Daily Activity     Outcome Measure Help from another person eating meals?: A Little Help from another person taking care of personal grooming?: A Little Help from another person toileting, which includes using toliet, bedpan, or urinal?: A Lot Help from another person bathing (including washing, rinsing, drying)?: A Lot Help from another person to put on and taking off regular upper body clothing?: A Little Help from another person to put on and taking off regular lower body clothing?: Total 6 Click Score: 14   End of Session Equipment Utilized During Treatment: Rolling walker (2 wheels);Gait belt Nurse Communication: Mobility status;Other (comment) (leaking feeding tube)  Activity Tolerance: Patient tolerated treatment well Patient left: in chair;with call bell/phone within reach;with chair alarm set  OT Visit Diagnosis: Unsteadiness on feet (R26.81);Other abnormalities of gait and mobility (R26.89);Muscle weakness (generalized) (M62.81);Other symptoms and signs involving cognitive function  Time: 4758-3074 OT Time Calculation (min): 56 min Charges:  OT General Charges $OT Visit: 1 Visit OT Evaluation $OT Eval Moderate Complexity: 1 Mod OT Treatments $Self Care/Home Management : 23-37 mins  Nestor Lewandowsky, OTR/L Acute Rehabilitation Services Pager: [phone] Office: [phone]   Malka So 05/11/2022, 1:33 PM

## 2022-05-11 NOTE — Evaluation (Signed)
Physical Therapy Evaluation Patient Details Name: Erik Turner MRN: [id number] DOB: 1956/01/28 Today's Date: 05/11/2022  History of Present Illness  66 yo M present to [facility] on 5/20 for acute encephalpathy after found on the floor. Resides at [facility]. 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 PTA patient was a resident at [facility] with functional status unknown. As recently as March pt was hospitalized and was ambulating modified independentwith RW.  Pt currently requires minguard assist with RW due to confusion and recent fall (minguard for safety).  Anticipate patient will benefit from acute PT to address problems listed below. Will continue to follow acutely to maximize functional mobility independence and safety.          Recommendations for follow up therapy are one component of a multi-disciplinary discharge planning process, led by the attending physician.  Recommendations may be updated based on patient status, additional functional criteria and insurance authorization.  Follow Up Recommendations Long-term institutional care without follow-up therapy    Assistance Recommended at Discharge Frequent or constant Supervision/Assistance  Patient can return home with the following  A little help with walking and/or transfers;Assistance with cooking/housework;Direct supervision/assist for medications management;Direct supervision/assist for financial management;Help with stairs or ramp for entrance    Equipment Recommendations Rolling walker (2 wheels)  Recommendations for Other Services       Functional Status Assessment Patient has had a recent decline in their functional status and demonstrates the ability to make significant improvements in function in a reasonable and predictable amount of time.     Precautions / Restrictions  Precautions Precautions: Fall      Mobility  Bed Mobility Overal bed mobility: Needs Assistance Bed Mobility: Supine to Sit     Supine to sit: Mod assist, HOB elevated     General bed mobility comments: assist to elevate trunk, pt then able to scoot out to EOB with supervsion    Transfers Overall transfer level: Needs assistance Equipment used: Rolling walker (2 wheels) Transfers: Sit to/from Stand Sit to Stand: Min assist, +2 safety/equipment           General transfer comment: poor use of RW (grabs handles to pull up); +2 for telemetry, IV, feeding tube    Ambulation/Gait Ambulation/Gait assistance: Min guard Gait Distance (Feet): 15 Feet (seated on toilet; 15) Assistive device: Rolling walker (2 wheels) Gait Pattern/deviations: Step-through pattern, Decreased stride length       General Gait Details: very short steps, shuffling; no imbalance noted with light use of RW  Stairs            Wheelchair Mobility    Modified Rankin (Stroke Patients Only)       Balance Overall balance assessment: Needs assistance, History of Falls Sitting-balance support: No upper extremity supported, Feet supported Sitting balance-Leahy Scale: Poor Sitting balance - Comments: initially with posterior lean; progressed to fair with time   Standing balance support: No upper extremity supported Standing balance-Leahy Scale: Fair Standing balance comment: stood without UE support prior to sitting on toilet                             Pertinent Vitals/Pain Pain Assessment Pain Assessment: Faces Faces Pain Scale: No hurt    Home Living Family/patient expects to be discharged to:: Other (Comment)  Additional Comments: resident, long-term care    Prior Function Prior Level of Function : Patient poor historian/Family not available             Mobility Comments: appeared familiar with use of RW and per chart used one previously        Hand Dominance   Dominant Hand: Right    Extremity/Trunk Assessment   Upper Extremity Assessment Upper Extremity Assessment: Defer to OT evaluation    Lower Extremity Assessment Lower Extremity Assessment: Overall WFL for tasks assessed    Cervical / Trunk Assessment Cervical / Trunk Assessment: Normal  Communication   Communication: No difficulties  Cognition Arousal/Alertness: Awake/alert Behavior During Therapy: WFL for tasks assessed/performed Overall Cognitive Status: No family/caregiver present to determine baseline cognitive functioning                                 General Comments: following simple commands; oriented to self only        General Comments General comments (skin integrity, edema, etc.): VSS on telemetry and pulse ox on RA    Exercises     Assessment/Plan    PT Assessment Patient needs continued PT services  PT Problem List Decreased balance;Decreased mobility;Decreased cognition;Decreased knowledge of use of DME       PT Treatment Interventions DME instruction;Gait training;Functional mobility training;Therapeutic activities;Therapeutic exercise;Balance training;Cognitive remediation;Patient/family education    PT Goals (Current goals can be found in the Care Plan section)  Acute Rehab PT Goals Patient Stated Goal: to go home PT Goal Formulation: Patient unable to participate in goal setting Time For Goal Achievement: 05/25/22 Potential to Achieve Goals: Good    Frequency Min 2X/week     Co-evaluation PT/OT/SLP Co-Evaluation/Treatment: Yes Reason for Co-Treatment: For patient/therapist safety;To address functional/ADL transfers           AM-PAC PT "6 Clicks" Mobility  Outcome Measure Help needed turning from your back to your side while in a flat bed without using bedrails?: A Little Help needed moving from lying on your back to sitting on the side of a flat bed without using bedrails?: A Lot Help needed  moving to and from a bed to a chair (including a wheelchair)?: A Little Help needed standing up from a chair using your arms (e.g., wheelchair or bedside chair)?: A Little Help needed to walk in hospital room?: A Little Help needed climbing 3-5 steps with a railing? : A Lot 6 Click Score: 16    End of Session   Activity Tolerance: Patient tolerated treatment well Patient left: in chair;with call bell/phone within reach;with chair alarm set;Other (comment) (with OT attempting breakfast) Nurse Communication: Mobility status;Other (comment) (cortrak leaking at connection) PT Visit Diagnosis: Difficulty in walking, not elsewhere classified (R26.2)    Time: 6060-0459 PT Time Calculation (min) (ACUTE ONLY): 24 min   Charges:   PT Evaluation $PT Eval Low Complexity: [place], PT Acute Rehabilitation Services  Pager [phone] Office [phone]   Rexanne Mano 05/11/2022, 10:29 AM

## 2022-05-11 NOTE — Progress Notes (Signed)
PT Cancellation Note  Patient Details Name: MERRIC YOST MRN: [id number] DOB: [birth year]   Cancelled Treatment:    Reason Eval/Treat Not Completed: Patient at procedure or test/unavailable  Undergoing patient care with nursing. Will return later today for PT evaluation   Arby Barrette, [place]  Pager [phone] Office [phone]   Rexanne Mano 05/11/2022, 9:24 AM

## 2022-05-11 NOTE — Progress Notes (Signed)
Speech Language Pathology Treatment: Dysphagia  Patient Details Name: NEKO BOYAJIAN MRN: [id number] DOB: [birth year] Today's Date: 05/11/2022 Time: 3662-9476 SLP Time Calculation (min) (ACUTE ONLY): 20 min  Assessment / Plan / Recommendation Clinical Impression  Patient seen by SLP for skilled therapy session focused on dysphagia goals and trials of upgraded solids. Patient was awake and alert without any instances of lethargy or fatigue observed. He was able to follow basic directions to assist SLP with bed mobility to reposition more upright. Patient then consumed graham crackers, chocolate pudding and drank thin liquids (water) via straw sips. Other than decreased mastication efficiency (patient edentulous) patient did not exhibit any overt s/s of aspiration or penetration and no oral residuals post swallows. Voice remained clear, oxygen saturations remained 99-100%. SLP is recommending upgrade to dysphagia 3 (mechanical soft) solids and continue with thin liquids. SLP will follow up briefly to ensure diet toleration and determine if able to upgrade to regular solids versus dysphagia 3 solids being least restrictive diet.    HPI HPI: 66 y.o. male, resident of [place], admitted 5/20 after being found down, unresponsive. Dx shock in setting of COVID 19 infection, renal failure, encephalopathy. PMHx HTN, HLD, DMT2, surgical hx of colectomy with ostomy.      SLP Plan  Continue with current plan of care      Recommendations for follow up therapy are one component of a multi-disciplinary discharge planning process, led by the attending physician.  Recommendations may be updated based on patient status, additional functional criteria and insurance authorization.    Recommendations  Diet recommendations: Thin liquid;Dysphagia 3 (mechanical soft) Liquids provided via: Straw;Cup Medication Administration: Whole meds with puree Supervision: Full supervision/cueing for compensatory  strategies;Staff to assist with self feeding Compensations: Minimize environmental distractions;Small sips/bites;Slow rate Postural Changes and/or Swallow Maneuvers: Seated upright 90 degrees                Oral Care Recommendations: Oral care BID;Staff/trained caregiver to provide oral care Follow Up Recommendations: Skilled nursing-short term rehab (<3 hours/day) Assistance recommended at discharge: Frequent or constant Supervision/Assistance SLP Visit Diagnosis: Dysphagia, unspecified (R13.10) Plan: Continue with current plan of care          Sonia Baller, MA, CCC-SLP Speech Therapy

## 2022-05-11 NOTE — Progress Notes (Signed)
PROGRESS NOTE        PATIENT DETAILS Name: Erik [place] Age: 66 y.o. Sex: male Date of Birth: Aug 25, 1956 Admit Date: 05/06/2022 Admitting Physician Laurin Coder, MD PCP:Pcp, No  Brief Summary: Patient is a 66 y.o.  male with history of HTN, HLD, DM-2, s/p colectomy-who was found on the floor at his SNF-he was hypotensive-he was found to have AKI with a creatinine of around 14-he was subsequently admitted to the [facility] with supportive care-and upon further improvement-transferred to [facility] service.   Significant events: 5/20>> hypotensive/encephalopathic-AKI-to ICU. 5/24>> transfer to [facility]  Significant studies: 5/20>> CT head: No acute intracranial abnormality. 5/20>>CT C-spine: Osteopenia/degenerative changes-no fracture/malalignment 5/20>> CT abdomen/pelvis: No acute findings. 5/25>> Echo: EF 25-42%, grade 1 diastolic dysfunction.  Significant microbiology data: 5/20>> blood culture: No growth 5/20>> urine culture: No growth 5/20>> COVID PCR: Positive  Procedures: None  Consults: Renal, PCCM  Subjective: Much more awake and alert compared to yesterday.  Denies any chest pain or shortness of breath.  Objective: Vitals: Blood pressure (!) 160/85, pulse (!) 101, temperature 98 F (36.7 C), temperature source Oral, resp. rate 20, height '5\' 6"'$  (1.676 m), weight 106.3 kg, SpO2 97 %.   Exam: Gen Exam:Alert awake-not in any distress-chronically frail appearing. HEENT:atraumatic, normocephalic Chest: B/L clear to auscultation anteriorly CVS:S1S2 regular Abdomen:soft non tender, non distended Extremities:no edema Neurology: Non focal Skin: no rash   Pertinent Labs/Radiology:    Latest Ref Rng & Units 05/11/2022    4:42 AM 05/10/2022    8:06 AM 05/09/2022    6:34 AM  CBC  WBC 4.0 - 10.5 K/uL 4.1   4.9   5.7    Hemoglobin 13.0 - 17.0 g/dL 9.8   10.1   9.5    Hematocrit 39.0 - 52.0 % 30.8   30.3   27.2    Platelets 150 - 400 K/uL 108    94   104      Lab Results  Component Value Date   NA 148 (H) 05/11/2022   K 3.6 05/11/2022   CL 106 05/11/2022   CO2 37 (H) 05/11/2022       Assessment/Plan: AKI: Due to ATN in the setting of hypotension/COVID-19 infection.  Managed with supportive care-creatinine significantly better-avoid nephrotoxic agents-follow electrolytes periodically.    Severe hyperkalemia: In the setting of AKI/losartan use-resolved.  Severe metabolic acidosis: Due to AKI-resolved-managed with bicarb infusion.  Hypernatremia: Start free water via NG tube.  Recheck electrolytes tomorrow.  Hypotension-due to combination of hypovolemia and septic shock from COVID-19 infection: Resolved-all cultures negative to date.  BP stable.  Acute metabolic encephalopathy: Due to AKI/COVID-19 infection-mentation has markedly improved-awake and alert this morning.  Continue supportive care.    Dysphagia: In the setting of encephalopathy-continue to encourage oral intake-appreciate SLP evaluation, if oral intake is stable-we can contemplate discontinuing NG tube feedings.  PAF with RVR: Maintaining sinus rhythm-had a very brief episode of RVR while in the ICU-required IV amiodarone.  Echo with stable EF-TSH normal on 5/20.  Since very brief-unprovoked by acute illness-avoiding anticoagulation (has ongoing thrombocytopenia as well)  Thrombocytopenia: Probably related to sepsis physiology/COVID-19 infection-platelet counts low but stable-continue to watch closely.    Normocytic anemia: Due to acute illness/AKI-follow CBC.  DM-2 (A1c 6.6 on 5/20): CBGs remain stable-continue Semglee 10 units daily and SSI.  Recent Labs    05/10/22 2325  05/11/22 0327 05/11/22 0921  GLUCAP 155* 131* 151*     HLD: Statin over the next few days.  HTN: BP slowly creeping up-we will start low-dose beta-blocker given he had A-fib while in the ICU.   History of colostomy  Nutrition Status: Nutrition Problem: Increased nutrient  needs Etiology: acute illness (COVID-19) Signs/Symptoms: estimated needs Interventions: Tube feeding, MVI    Pressure Ulcer: Pressure Injury 05/06/22 Buttocks Right Stage 1 -  Intact skin with non-blanchable redness of a localized area usually over a bony prominence. (Active)  05/06/22   Location: Buttocks  Location Orientation: Right  Staging: Stage 1 -  Intact skin with non-blanchable redness of a localized area usually over a bony prominence.  Wound Description (Comments):   Present on Admission: Yes  Dressing Type Foam - Lift dressing to assess site every shift 05/09/22 0800     Pressure Injury 05/07/22 Thigh Right;Lateral Stage 1 -  Intact skin with non-blanchable redness of a localized area usually over a bony prominence. (Active)  05/07/22   Location: Thigh  Location Orientation: Right;Lateral  Staging: Stage 1 -  Intact skin with non-blanchable redness of a localized area usually over a bony prominence.  Wound Description (Comments):   Present on Admission: Yes  Dressing Type Foam - Lift dressing to assess site every shift 05/09/22 0800     Pressure Injury 05/06/22 Buttocks Right;Medial Stage 1 -  Intact skin with non-blanchable redness of a localized area usually over a bony prominence. (Active)  05/06/22   Location: Buttocks  Location Orientation: Right;Medial  Staging: Stage 1 -  Intact skin with non-blanchable redness of a localized area usually over a bony prominence.  Wound Description (Comments):   Present on Admission: Yes  Dressing Type Foam - Lift dressing to assess site every shift 05/09/22 0800     Pressure Injury 05/06/22 Buttocks Right;Lateral Stage 1 -  Intact skin with non-blanchable redness of a localized area usually over a bony prominence. (Active)  05/06/22   Location: Buttocks  Location Orientation: Right;Lateral  Staging: Stage 1 -  Intact skin with non-blanchable redness of a localized area usually over a bony prominence.  Wound Description  (Comments):   Present on Admission: Yes  Dressing Type Foam - Lift dressing to assess site every shift 05/08/22 1600     Pressure Injury 02/06/22 Flank Right;Lateral Stage 1 -  Intact skin with non-blanchable redness of a localized area usually over a bony prominence. (Active)  02/06/22   Location: Flank  Location Orientation: Right;Lateral  Staging: Stage 1 -  Intact skin with non-blanchable redness of a localized area usually over a bony prominence.  Wound Description (Comments):   Present on Admission: Yes  Dressing Type None 05/08/22 1600   Obesity: Estimated body mass index is 37.82 kg/m as calculated from the following:   Height as of this encounter: '5\' 6"'$  (1.676 m).   Weight as of this encounter: 106.3 kg.   Code status:   Code Status: Full Code   DVT Prophylaxis: heparin injection 5,000 Units Start: 05/06/22 2200   Family Communication: Spouse-Helen-[phone]-left VM on 5/25   Disposition Plan: Status is: Inpatient Remains inpatient appropriate because:    Planned Discharge Destination:Skilled nursing facility   Diet: Diet Order             Diet full liquid Room service appropriate? No; Fluid consistency: Thin  Diet effective now  Antimicrobial agents: Anti-infectives (From admission, onward)    Start     Dose/Rate Route Frequency Ordered Stop   05/07/22 2200  ceFEPIme (MAXIPIME) 1 g in sodium chloride 0.9 % 100 mL IVPB  Status:  Discontinued        1 g 200 mL/hr over 30 Minutes Intravenous Every 24 hours 05/06/22 2117 05/08/22 0944   05/06/22 2117  vancomycin variable dose per unstable renal function (pharmacist dosing)  Status:  Discontinued         Does not apply See admin instructions 05/06/22 2117 05/08/22 0944   05/06/22 1915  metroNIDAZOLE (FLAGYL) IVPB 500 mg        500 mg 100 mL/hr over 60 Minutes Intravenous  Once 05/06/22 1904 05/06/22 2031   05/06/22 1915  vancomycin (VANCOREADY) IVPB 1750 mg/350 mL        1,750  mg 175 mL/hr over 120 Minutes Intravenous  Once 05/06/22 1912 05/06/22 2236   05/06/22 1915  ceFEPIme (MAXIPIME) 2 g in sodium chloride 0.9 % 100 mL IVPB        2 g 200 mL/hr over 30 Minutes Intravenous  Once 05/06/22 1912 05/06/22 2031        MEDICATIONS: Scheduled Meds:  chlorhexidine  15 mL Mouth Rinse BID   Chlorhexidine Gluconate Cloth  6 each Topical Daily   feeding supplement (PROSource TF)  45 mL Per Tube TID   free water  200 mL Per Tube Q8H   heparin  5,000 Units Subcutaneous Q8H   insulin aspart  0-15 Units Subcutaneous Q4H   insulin glargine-yfgn  10 Units Subcutaneous Daily   mouth rinse  15 mL Mouth Rinse q12n4p   melatonin  3 mg Oral QHS   multivitamin with minerals  1 tablet Per Tube Daily   nystatin cream   Topical TID   QUEtiapine  25 mg Oral QHS   Continuous Infusions:  sodium chloride     feeding supplement (OSMOLITE 1.2 CAL) 1,000 mL (05/10/22 0535)   PRN Meds:.acetaminophen, docusate sodium, haloperidol lactate, levalbuterol, polyethylene glycol   I have personally reviewed following labs and imaging studies  LABORATORY DATA: CBC: Recent Labs  Lab 05/06/22 1915 05/06/22 1955 05/07/22 0329 05/08/22 0030 05/09/22 0634 05/10/22 0806 05/11/22 0442  WBC 31.6*   < > 22.3* 9.6 5.7 4.9 4.1  NEUTROABS 27.9*  --   --   --   --   --   --   HGB 13.4   < > 12.0* 10.4* 9.5* 10.1* 9.8*  HCT 39.1   < > 32.8* 28.4* 27.2* 30.3* 30.8*  MCV 88.7   < > 83.7 82.3 85.0 89.4 91.9  PLT 299   < > 186 132* 104* 94* 108*   < > = values in this interval not displayed.     Basic Metabolic Panel: Recent Labs  Lab 05/06/22 2130 05/06/22 2221 05/08/22 0030 05/09/22 0938 05/09/22 1631 05/10/22 0806 05/11/22 0442  NA  --    < > 135 136 139 144 148*  K  --    < > 3.4* 3.0* 3.4* 3.6 3.6  CL  --    < > 96* 90* 93* 101 106  CO2  --    < > 16* 30 32 32 37*  GLUCOSE  --    < > 133* 243* 252* 147* 140*  BUN  --    < > 130* 115* 106* 81* 55*  CREATININE 13.53*   < >  10.10* 4.46* 3.18* 1.80* 1.29*  CALCIUM  --    < > 7.5* 7.9* 8.2* 8.4* 8.6*  MG 1.8  --  1.4* 2.1  --  1.8  --   PHOS 9.0*  --   --   --   --   --   --    < > = values in this interval not displayed.     GFR: Estimated Creatinine Clearance: 63.5 mL/min (A) (by C-G formula based on SCr of 1.29 mg/dL (H)).  Liver Function Tests: Recent Labs  Lab 05/06/22 1915 05/11/22 0442  AST 19 36  ALT 20 20  ALKPHOS 68 41  BILITOT 0.6 0.5  PROT 6.6 5.4*  ALBUMIN 3.5 2.5*    No results for input(s): LIPASE, AMYLASE in the last 168 hours. Recent Labs  Lab 05/06/22 2130  AMMONIA 26     Coagulation Profile: Recent Labs  Lab 05/06/22 1915  INR 1.4*     Cardiac Enzymes: Recent Labs  Lab 05/06/22 1915  CKTOTAL 116     BNP (last 3 results) No results for input(s): PROBNP in the last 8760 hours.  Lipid Profile: No results for input(s): CHOL, HDL, LDLCALC, TRIG, CHOLHDL, LDLDIRECT in the last 72 hours.  Thyroid Function Tests: No results for input(s): TSH, T4TOTAL, FREET4, T3FREE, THYROIDAB in the last 72 hours.  Anemia Panel: No results for input(s): VITAMINB12, FOLATE, FERRITIN, TIBC, IRON, RETICCTPCT in the last 72 hours.  Urine analysis:    Component Value Date/Time   COLORURINE AMBER (A) 05/07/2022 0916   APPEARANCEUR CLOUDY (A) 05/07/2022 0916   LABSPEC 1.018 05/07/2022 0916   PHURINE 5.0 05/07/2022 0916   GLUCOSEU NEGATIVE 05/07/2022 0916   HGBUR LARGE (A) 05/07/2022 0916   BILIRUBINUR NEGATIVE 05/07/2022 0916   KETONESUR NEGATIVE 05/07/2022 0916   PROTEINUR 100 (A) 05/07/2022 0916   NITRITE NEGATIVE 05/07/2022 0916   LEUKOCYTESUR LARGE (A) 05/07/2022 0916    Sepsis Labs: Lactic Acid, Venous    Component Value Date/Time   LATICACIDVEN 1.6 05/08/2022 0725    MICROBIOLOGY: Recent Results (from the past 240 hour(s))  Resp Panel by RT-PCR (Flu A&B, Covid) Nasopharyngeal Swab     Status: Abnormal   Collection Time: 05/06/22  7:04 PM   Specimen:  Nasopharyngeal Swab; Nasopharyngeal(NP) swabs in vial transport medium  Result Value Ref Range Status   SARS Coronavirus 2 by RT PCR POSITIVE (A) NEGATIVE Final    Comment: (NOTE) SARS-CoV-2 target nucleic acids are DETECTED.  The SARS-CoV-2 RNA is generally detectable in upper respiratory specimens during the acute phase of infection. Positive results are indicative of the presence of the identified virus, but do not rule out bacterial infection or co-infection with other pathogens not detected by the test. Clinical correlation with patient history and other diagnostic information is necessary to determine patient infection status. The expected result is Negative.  Fact Sheet for Patients: EntrepreneurPulse.com.au  Fact Sheet for Healthcare Providers: IncredibleEmployment.be  This test is not yet approved or cleared by the [place] FDA and  has been authorized for detection and/or diagnosis of SARS-CoV-2 by FDA under an Emergency Use Authorization (EUA).  This EUA will remain in effect (meaning this test can be used) for the duration of  the COVID-19 declaration under Section 564(b)(1) of the A ct, 21 U.S.C. section 360bbb-3(b)(1), unless the authorization is terminated or revoked sooner.     Influenza A by PCR NEGATIVE NEGATIVE Final   Influenza B by PCR NEGATIVE NEGATIVE Final    Comment: (NOTE) The Xpert Xpress SARS-CoV-2/FLU/RSV plus  assay is intended as an aid in the diagnosis of influenza from Nasopharyngeal swab specimens and should not be used as a sole basis for treatment. Nasal washings and aspirates are unacceptable for Xpert Xpress SARS-CoV-2/FLU/RSV testing.  Fact Sheet for Patients: EntrepreneurPulse.com.au  Fact Sheet for Healthcare Providers: IncredibleEmployment.be  This test is not yet approved or cleared by the [place] FDA and has been authorized for detection and/or  diagnosis of SARS-CoV-2 by FDA under an Emergency Use Authorization (EUA). This EUA will remain in effect (meaning this test can be used) for the duration of the COVID-19 declaration under Section 564(b)(1) of the Act, 21 U.S.C. section 360bbb-3(b)(1), unless the authorization is terminated or revoked.  Performed at [facility], [place] [address] [place], [place] [address]   Culture, blood (routine x 2)     Status: None   Collection Time: 05/06/22  7:13 PM   Specimen: BLOOD RIGHT WRIST  Result Value Ref Range Status   Specimen Description BLOOD RIGHT WRIST  Final   Special Requests   Final    BOTTLES DRAWN AEROBIC AND ANAEROBIC Blood Culture results may not be optimal due to an inadequate volume of blood received in culture bottles   Culture   Final    NO GROWTH 5 DAYS Performed at [facility], [place] [address], [place], [place] [address]    Report Status 05/11/2022 FINAL  Final  Culture, blood (routine x 2)     Status: None   Collection Time: 05/06/22  7:15 PM   Specimen: Left Antecubital; Blood  Result Value Ref Range Status   Specimen Description LEFT ANTECUBITAL  Final   Special Requests   Final    BOTTLES DRAWN AEROBIC AND ANAEROBIC Blood Culture adequate volume   Culture   Final    NO GROWTH 5 DAYS Performed at [facility], [place] [address], [place], [place] [address]    Report Status 05/11/2022 FINAL  Final  Urine Culture     Status: None   Collection Time: 05/06/22  8:58 PM   Specimen: Urine, Clean Catch  Result Value Ref Range Status   Specimen Description URINE, CLEAN CATCH  Final   Special Requests NONE  Final   Culture   Final    NO GROWTH Performed at [facility], [place] [address], [place], [place] [address]    Report Status 05/08/2022 FINAL  Final  MRSA Next Gen by PCR, Nasal     Status: None   Collection Time: 05/06/22 11:16 PM   Specimen: Nasal Mucosa; Nasal Swab  Result Value Ref Range Status   MRSA by PCR Next Gen NOT DETECTED NOT  DETECTED Final    Comment: (NOTE) The GeneXpert MRSA Assay (FDA approved for NASAL specimens only), is one component of a comprehensive MRSA colonization surveillance program. It is not intended to diagnose MRSA infection nor to guide or monitor treatment for MRSA infections. Test performance is not FDA approved in patients less than 91 years old. Performed at [facility], [place] [address] [place],  [address]     RADIOLOGY STUDIES/RESULTS: DG Abd 1 View  Result Date: 05/09/2022 CLINICAL DATA:  NG tube placement EXAM: ABDOMEN - 1 VIEW COMPARISON:  05/07/2022 FINDINGS: Enteric tube tip is present in the upper mid abdomen consistent with location in the distal stomach. Paucity of gas in the visualized upper abdomen. IMPRESSION: Enteric tube tip is in the upper abdomen consistent with location in the distal stomach. Electronically  Signed   By: Lucienne Capers M.D.   On: 05/09/2022 21:50   DG Abd Portable 1V  Result Date: 05/10/2022 CLINICAL DATA:  Feeding tube placement. EXAM: PORTABLE ABDOMEN - 1 VIEW COMPARISON:  05/09/2022 FINDINGS: A small bore feeding tube is noted with tip overlying the peripyloric region. Bowel gas pattern is unremarkable. IMPRESSION: Small bore feeding tube with tip overlying the peripyloric region. Electronically Signed   By: Margarette Canada M.D.   On: 05/10/2022 14:49   ECHOCARDIOGRAM COMPLETE  Result Date: 05/10/2022    ECHOCARDIOGRAM REPORT   Patient Name:   Erik [place] [facility] Date of Exam: 05/10/2022 Medical Rec #:  [id number]       Height:       66.0 in Accession #:    [id number]      Weight:       239.2 lb Date of Birth:  26-Dec-1955        BSA:          2.158 m Patient Age:    66 years        BP:           140/78 mmHg Patient Gender: M               HR:           96 bpm. Exam Location:  Inpatient Procedure: 2D Echo, Cardiac Doppler, Color Doppler and Intracardiac            Opacification Agent Indications:    AF  History:        Patient has no prior history of  Echocardiogram examinations.                 Risk Factors:Hypertension.  Sonographer:    Luisa Hart RDCS Referring Phys: [place]  Sonographer Comments: Technically difficult study due to poor echo windows. IMPRESSIONS  1. Left ventricular ejection fraction, by estimation, is 60 to 65%. The left ventricle has normal function. The left ventricle has no regional wall motion abnormalities. Left ventricular diastolic parameters are consistent with Grade I diastolic dysfunction (impaired relaxation).  2. Right ventricular systolic function is normal. The right ventricular size is normal. There is normal pulmonary artery systolic pressure. The estimated right ventricular systolic pressure is 38.2 mmHg.  3. The mitral valve is normal in structure. No evidence of mitral valve regurgitation. No evidence of mitral stenosis.  4. The aortic valve is tricuspid. Aortic valve regurgitation is not visualized. No aortic stenosis is present.  5. Aortic dilatation noted. There is mild dilatation of the aortic root, measuring 38 mm.  6. The inferior vena cava is normal in size with greater than 50% respiratory variability, suggesting right atrial pressure of 3 mmHg. FINDINGS  Left Ventricle: Left ventricular ejection fraction, by estimation, is 60 to 65%. The left ventricle has normal function. The left ventricle has no regional wall motion abnormalities. The left ventricular internal cavity size was normal in size. There is  no left ventricular hypertrophy. Left ventricular diastolic parameters are consistent with Grade I diastolic dysfunction (impaired relaxation). Right Ventricle: The right ventricular size is normal. No increase in right ventricular wall thickness. Right ventricular systolic function is normal. There is normal pulmonary artery systolic pressure. The tricuspid regurgitant velocity is 2.23 m/s, and  with an assumed right atrial pressure of 3 mmHg, the estimated right ventricular systolic pressure is  50.5 mmHg. Left Atrium: Left atrial size was normal in size. Right Atrium: Right atrial size was normal  in size. Pericardium: There is no evidence of pericardial effusion. Mitral Valve: The mitral valve is normal in structure. Mild mitral annular calcification. No evidence of mitral valve regurgitation. No evidence of mitral valve stenosis. Tricuspid Valve: The tricuspid valve is normal in structure. Tricuspid valve regurgitation is trivial. Aortic Valve: The aortic valve is tricuspid. Aortic valve regurgitation is not visualized. No aortic stenosis is present. Aortic valve mean gradient measures 8.0 mmHg. Aortic valve peak gradient measures 13.2 mmHg. Pulmonic Valve: The pulmonic valve was normal in structure. Pulmonic valve regurgitation is not visualized. Aorta: Aortic dilatation noted. There is mild dilatation of the aortic root, measuring 38 mm. Venous: The inferior vena cava is normal in size with greater than 50% respiratory variability, suggesting right atrial pressure of 3 mmHg. IAS/Shunts: No atrial level shunt detected by color flow Doppler.  LEFT VENTRICLE PLAX 2D LVIDd:         3.00 cm     Diastology LVIDs:         2.80 cm     LV e' medial:    6.38 cm/s LV PW:         2.00 cm     LV E/e' medial:  12.4 LV IVS:        1.40 cm     LV e' lateral:   9.08 cm/s LVOT diam:     2.20 cm     LV E/e' lateral: 8.7 LVOT Area:     3.80 cm  LV Volumes (MOD) LV vol d, MOD A4C: 47.0 ml LV vol s, MOD A4C: 10.5 ml LV SV MOD A4C:     47.0 ml RIGHT VENTRICLE RV Basal diam:  2.60 cm RV Mid diam:    2.00 cm RV S prime:     18.30 cm/s TAPSE (M-mode): 1.9 cm LEFT ATRIUM           Index        RIGHT ATRIUM           Index LA diam:      3.20 cm 1.48 cm/m   RA Area:     17.10 cm LA Vol (A2C): 14.3 ml 6.63 ml/m   RA Volume:   36.70 ml  17.01 ml/m LA Vol (A4C): 35.2 ml 16.31 ml/m  AORTIC VALVE               PULMONIC VALVE AV Vmax:      182.00 cm/s  PV Vmax:       1.04 m/s AV Vmean:     125.550 cm/s PV Vmean:      75.500 cm/s AV  VTI:       0.292 m      PV VTI:        0.178 m AV Peak Grad: 13.2 mmHg    PV Peak grad:  4.3 mmHg AV Mean Grad: 8.0 mmHg     PV Mean grad:  3.0 mmHg  AORTA Ao Root diam: 3.80 cm Ao Asc diam:  3.55 cm MITRAL VALVE                TRICUSPID VALVE MV Area (PHT): 3.53 cm     TR Peak grad:   19.9 mmHg MV Decel Time: 215 msec     TR Vmax:        223.00 cm/s MV E velocity: 79.00 cm/s MV A velocity: 111.00 cm/s  SHUNTS MV E/A ratio:  0.71         Systemic Diam: 2.20 cm Dalton  McleanMD Electronically signed by Franki Monte Signature Date/Time: 05/10/2022/3:20:48 PM    Final      LOS: 5 days   Oren Binet, MD  [facility]    To contact the attending provider between 7A-7P or the covering provider during after hours 7P-7A, please log into the web site [email or web address] and access using universal [place] password for that web site. If you do not have the password, please call the hospital operator.  05/11/2022, 12:36 PM

## 2022-05-12 DIAGNOSIS — I959 Hypotension, unspecified: Secondary | ICD-10-CM | POA: Diagnosis not present

## 2022-05-12 DIAGNOSIS — E875 Hyperkalemia: Secondary | ICD-10-CM | POA: Diagnosis not present

## 2022-05-12 DIAGNOSIS — N179 Acute kidney failure, unspecified: Secondary | ICD-10-CM | POA: Diagnosis not present

## 2022-05-12 DIAGNOSIS — A419 Sepsis, unspecified organism: Secondary | ICD-10-CM | POA: Diagnosis not present

## 2022-05-12 LAB — BASIC METABOLIC PANEL
Anion gap: 6 (ref 5–15)
BUN: 35 mg/dL — ABNORMAL HIGH (ref 8–23)
CO2: 30 mmol/L (ref 22–32)
Calcium: 8.5 mg/dL — ABNORMAL LOW (ref 8.9–10.3)
Chloride: 110 mmol/L (ref 98–111)
Creatinine, Ser: 1.12 mg/dL (ref 0.61–1.24)
GFR, Estimated: >60 mL/min (ref >60)
Glucose, Bld: 153 mg/dL — ABNORMAL HIGH (ref 70–99)
Potassium: 3.5 mmol/L (ref 3.5–5.1)
Sodium: 146 mmol/L — ABNORMAL HIGH (ref 135–145)

## 2022-05-12 LAB — GLUCOSE, CAPILLARY
Glucose-Capillary: 111 mg/dL — ABNORMAL HIGH (ref 70–99)
Glucose-Capillary: 122 mg/dL — ABNORMAL HIGH (ref 70–99)
Glucose-Capillary: 145 mg/dL — ABNORMAL HIGH (ref 70–99)
Glucose-Capillary: 150 mg/dL — ABNORMAL HIGH (ref 70–99)
Glucose-Capillary: 156 mg/dL — ABNORMAL HIGH (ref 70–99)
Glucose-Capillary: 199 mg/dL — ABNORMAL HIGH (ref 70–99)

## 2022-05-12 LAB — CBC
HCT: 32.1 % — ABNORMAL LOW (ref 39.0–52.0)
Hemoglobin: 10.5 g/dL — ABNORMAL LOW (ref 13.0–17.0)
MCH: 30 pg (ref 26.0–34.0)
MCHC: 32.7 g/dL (ref 30.0–36.0)
MCV: 91.7 fL (ref 80.0–100.0)
Platelets: 112 10*3/uL — ABNORMAL LOW (ref 150–400)
RBC: 3.5 MIL/uL — ABNORMAL LOW (ref 4.22–5.81)
RDW: 13.3 % (ref 11.5–15.5)
WBC: 5.9 10*3/uL (ref 4.0–10.5)
nRBC: 0 % (ref 0.0–0.2)

## 2022-05-12 LAB — MAGNESIUM: Magnesium: 1.2 mg/dL — ABNORMAL LOW (ref 1.7–2.4)

## 2022-05-12 MED ORDER — POTASSIUM CHLORIDE 20 MEQ PO PACK
40.0000 meq | PACK | Freq: Once | ORAL | Status: AC
  Administered 2022-05-12: 40 meq
  Filled 2022-05-12: qty 2

## 2022-05-12 MED ORDER — INSULIN ASPART 100 UNIT/ML IJ SOLN
0.0000 [IU] | Freq: Three times a day (TID) | INTRAMUSCULAR | Status: DC
  Administered 2022-05-12 – 2022-05-13 (×2): 2 [IU] via SUBCUTANEOUS

## 2022-05-12 MED ORDER — MAGNESIUM SULFATE 4 GM/100ML IV SOLN
4.0000 g | Freq: Once | INTRAVENOUS | Status: AC
  Administered 2022-05-12: 4 g via INTRAVENOUS
  Filled 2022-05-12: qty 100

## 2022-05-12 MED ORDER — TAMSULOSIN HCL 0.4 MG PO CAPS
0.4000 mg | ORAL_CAPSULE | Freq: Every day | ORAL | Status: DC
  Administered 2022-05-12 – 2022-05-13 (×2): 0.4 mg via ORAL
  Filled 2022-05-12 (×2): qty 1

## 2022-05-12 NOTE — Progress Notes (Signed)
PROGRESS NOTE        PATIENT DETAILS Name: Erik Turner Age: 66 y.o. Sex: male Date of Birth: March 01, 1956 Admit Date: 05/06/2022 Admitting Physician Laurin Coder, MD PCP:Pcp, No  Brief Summary: Patient is a 66 y.o.  male with history of HTN, HLD, DM-2, s/p colectomy-who was found on the floor at his SNF-he was hypotensive-he was found to have AKI with a creatinine of around 14-he was subsequently admitted to the [facility] with supportive care-and upon further improvement-transferred to [facility] service.   Significant events: 5/20>> hypotensive/encephalopathic-AKI-to ICU. 5/24>> transfer to [facility]  Significant studies: 5/20>> CT head: No acute intracranial abnormality. 5/20>>CT C-spine: Osteopenia/degenerative changes-no fracture/malalignment 5/20>> CT abdomen/pelvis: No acute findings. 5/25>> Echo: EF 28-31%, grade 1 diastolic dysfunction.  Significant microbiology data: 5/20>> blood culture: No growth 5/20>> urine culture: No growth 5/20>> COVID PCR: Positive  Procedures: None  Consults: Renal, PCCM  Subjective: Awake/alert this morning.  Follows commands able to tell me his name.  Not in any distress.  Objective: Vitals: Blood pressure (!) 158/109, pulse (!) 123, temperature 99.2 F (37.3 C), temperature source Axillary, resp. rate 18, height '5\' 6"'$  (1.676 m), weight 106.3 kg, SpO2 (!) 87 %.   Exam: Gen Exam:Alert awake-not in any distress HEENT:atraumatic, normocephalic Chest: B/L clear to auscultation anteriorly CVS:S1S2 regular Abdomen:soft non tender, non distended.  Yellow stool in ostomy bag Extremities:no edema Neurology: Non focal Skin: no rash   Pertinent Labs/Radiology:    Latest Ref Rng & Units 05/12/2022    5:02 AM 05/11/2022    4:42 AM 05/10/2022    8:06 AM  CBC  WBC 4.0 - 10.5 K/uL 5.9   4.1   4.9    Hemoglobin 13.0 - 17.0 g/dL 10.5   9.8   10.1    Hematocrit 39.0 - 52.0 % 32.1   30.8   30.3    Platelets 150 - 400  K/uL 112   108   94      Lab Results  Component Value Date   NA 146 (H) 05/12/2022   K 3.5 05/12/2022   CL 110 05/12/2022   CO2 30 05/12/2022       Assessment/Plan: AKI: Due to ATN in the setting of hypotension/COVID-19 infection.  Managed with supportive care-renal function has normalized.    Severe hyperkalemia: In the setting of AKI/losartan use-resolved.  Severe metabolic acidosis: Due to AKI-resolved  Hypernatremia: Improved-continue free water supplementation via tube.  Repeat electrolytes tomorrow.  Hypotension-due to combination of hypovolemia and septic shock from COVID-19 infection: Resolved-all cultures negative to date.  BP stable.  Acute metabolic encephalopathy: Due to AKI/COVID-19 infection-mentation has markedly improved-awake and alert this morning.  Continue supportive care.    Dysphagia: In the setting of encephalopathy-continue to encourage oral intake-appreciate SLP evaluation-completed-diet being advanced-if tolerates diet-we will be able to remove cortrak tube  PAF with RVR: Maintaining sinus rhythm-had a very brief episode of RVR while in the ICU-required IV amiodarone.  Echo with stable EF-TSH normal on 5/20.  Since very brief-unprovoked by acute illness-avoiding anticoagulation (has ongoing thrombocytopenia as well)  Thrombocytopenia: Probably related to sepsis physiology/COVID-19 infection-platelet count still low but stable and increasing slowly.    Normocytic anemia: Due to acute illness/AKI-follow CBC.  DM-2 (A1c 6.6 on 5/20): CBGs remain stable-continue Semglee 10 units daily and SSI.  Recent Labs    05/12/22 0041 05/12/22  0446 05/12/22 0834  GLUCAP 150* 145* 111*     HLD: Statin over the next few days.  HTN: BP stable-continue metoprolol.  History of colostomy  Nutrition Status: Nutrition Problem: Increased nutrient needs Etiology: acute illness (COVID-19) Signs/Symptoms: estimated needs Interventions: Ensure Enlive (each supplement  provides 350kcal and 20 grams of protein), MVI, Prostat, Tube feeding    Pressure Ulcer: Pressure Injury 05/06/22 Buttocks Right Stage 1 -  Intact skin with non-blanchable redness of a localized area usually over a bony prominence. (Active)  05/06/22   Location: Buttocks  Location Orientation: Right  Staging: Stage 1 -  Intact skin with non-blanchable redness of a localized area usually over a bony prominence.  Wound Description (Comments):   Present on Admission: Yes  Dressing Type Foam - Lift dressing to assess site every shift 05/09/22 0800     Pressure Injury 05/07/22 Thigh Right;Lateral Stage 1 -  Intact skin with non-blanchable redness of a localized area usually over a bony prominence. (Active)  05/07/22   Location: Thigh  Location Orientation: Right;Lateral  Staging: Stage 1 -  Intact skin with non-blanchable redness of a localized area usually over a bony prominence.  Wound Description (Comments):   Present on Admission: Yes  Dressing Type Foam - Lift dressing to assess site every shift 05/09/22 0800     Pressure Injury 05/06/22 Buttocks Right;Medial Stage 1 -  Intact skin with non-blanchable redness of a localized area usually over a bony prominence. (Active)  05/06/22   Location: Buttocks  Location Orientation: Right;Medial  Staging: Stage 1 -  Intact skin with non-blanchable redness of a localized area usually over a bony prominence.  Wound Description (Comments):   Present on Admission: Yes  Dressing Type Foam - Lift dressing to assess site every shift 05/09/22 0800     Pressure Injury 05/06/22 Buttocks Right;Lateral Stage 1 -  Intact skin with non-blanchable redness of a localized area usually over a bony prominence. (Active)  05/06/22   Location: Buttocks  Location Orientation: Right;Lateral  Staging: Stage 1 -  Intact skin with non-blanchable redness of a localized area usually over a bony prominence.  Wound Description (Comments):   Present on Admission: Yes   Dressing Type Foam - Lift dressing to assess site every shift 05/08/22 1600     Pressure Injury 02/06/22 Flank Right;Lateral Stage 1 -  Intact skin with non-blanchable redness of a localized area usually over a bony prominence. (Active)  02/06/22   Location: Flank  Location Orientation: Right;Lateral  Staging: Stage 1 -  Intact skin with non-blanchable redness of a localized area usually over a bony prominence.  Wound Description (Comments):   Present on Admission: Yes  Dressing Type None 05/08/22 1600   Obesity: Estimated body mass index is 37.82 kg/m as calculated from the following:   Height as of this encounter: '5\' 6"'$  (1.676 m).   Weight as of this encounter: 106.3 kg.   Code status:   Code Status: Full Code   DVT Prophylaxis: heparin injection 5,000 Units Start: 05/06/22 2200   Family Communication: Spouse-Helen-[phone]-left VM on 5/25   Disposition Plan: Status is: Inpatient Remains inpatient appropriate because:    Planned Discharge Destination:Skilled nursing facility   Diet: Diet Order             DIET DYS 3 Room service appropriate? Yes; Fluid consistency: Thin  Diet effective now                     Antimicrobial agents:  Anti-infectives (From admission, onward)    Start     Dose/Rate Route Frequency Ordered Stop   05/07/22 2200  ceFEPIme (MAXIPIME) 1 g in sodium chloride 0.9 % 100 mL IVPB  Status:  Discontinued        1 g 200 mL/hr over 30 Minutes Intravenous Every 24 hours 05/06/22 2117 05/08/22 0944   05/06/22 2117  vancomycin variable dose per unstable renal function (pharmacist dosing)  Status:  Discontinued         Does not apply See admin instructions 05/06/22 2117 05/08/22 0944   05/06/22 1915  metroNIDAZOLE (FLAGYL) IVPB 500 mg        500 mg 100 mL/hr over 60 Minutes Intravenous  Once 05/06/22 1904 05/06/22 2031   05/06/22 1915  vancomycin (VANCOREADY) IVPB 1750 mg/350 mL        1,750 mg 175 mL/hr over 120 Minutes Intravenous   Once 05/06/22 1912 05/06/22 2236   05/06/22 1915  ceFEPIme (MAXIPIME) 2 g in sodium chloride 0.9 % 100 mL IVPB        2 g 200 mL/hr over 30 Minutes Intravenous  Once 05/06/22 1912 05/06/22 2031        MEDICATIONS: Scheduled Meds:  chlorhexidine  15 mL Mouth Rinse BID   Chlorhexidine Gluconate Cloth  6 each Topical Daily   feeding supplement  237 mL Oral TID BM   feeding supplement (PROSource TF)  45 mL Per Tube TID   free water  200 mL Per Tube Q8H   heparin  5,000 Units Subcutaneous Q8H   insulin aspart  0-15 Units Subcutaneous Q4H   insulin glargine-yfgn  10 Units Subcutaneous Daily   mouth rinse  15 mL Mouth Rinse q12n4p   melatonin  3 mg Oral QHS   metoprolol tartrate  25 mg Oral BID   multivitamin with minerals  1 tablet Per Tube Daily   nystatin cream   Topical TID   potassium chloride  40 mEq Per Tube Once   QUEtiapine  25 mg Oral QHS   Continuous Infusions:  sodium chloride     feeding supplement (OSMOLITE 1.2 CAL) 1,000 mL (05/10/22 0535)   magnesium sulfate bolus IVPB     PRN Meds:.acetaminophen, docusate sodium, haloperidol lactate, levalbuterol, polyethylene glycol   I have personally reviewed following labs and imaging studies  LABORATORY DATA: CBC: Recent Labs  Lab 05/06/22 1915 05/06/22 1955 05/08/22 0030 05/09/22 0634 05/10/22 0806 05/11/22 0442 05/12/22 0502  WBC 31.6*   < > 9.6 5.7 4.9 4.1 5.9  NEUTROABS 27.9*  --   --   --   --   --   --   HGB 13.4   < > 10.4* 9.5* 10.1* 9.8* 10.5*  HCT 39.1   < > 28.4* 27.2* 30.3* 30.8* 32.1*  MCV 88.7   < > 82.3 85.0 89.4 91.9 91.7  PLT 299   < > 132* 104* 94* 108* 112*   < > = values in this interval not displayed.     Basic Metabolic Panel: Recent Labs  Lab 05/06/22 2130 05/06/22 2221 05/08/22 0030 05/09/22 3536 05/09/22 1631 05/10/22 0806 05/11/22 0442 05/12/22 0502  NA  --    < > 135 136 139 144 148* 146*  K  --    < > 3.4* 3.0* 3.4* 3.6 3.6 3.5  CL  --    < > 96* 90* 93* 101 106 110   CO2  --    < > 16* 30 32 32 37* 30  GLUCOSE  --    < > 133* 243* 252* 147* 140* 153*  BUN  --    < > 130* 115* 106* 81* 55* 35*  CREATININE 13.53*   < > 10.10* 4.46* 3.18* 1.80* 1.29* 1.12  CALCIUM  --    < > 7.5* 7.9* 8.2* 8.4* 8.6* 8.5*  MG 1.8  --  1.4* 2.1  --  1.8  --  1.2*  PHOS 9.0*  --   --   --   --   --   --   --    < > = values in this interval not displayed.     GFR: Estimated Creatinine Clearance: 73.1 mL/min (by C-G formula based on SCr of 1.12 mg/dL).  Liver Function Tests: Recent Labs  Lab 05/06/22 1915 05/11/22 0442  AST 19 36  ALT 20 20  ALKPHOS 68 41  BILITOT 0.6 0.5  PROT 6.6 5.4*  ALBUMIN 3.5 2.5*    No results for input(s): LIPASE, AMYLASE in the last 168 hours. Recent Labs  Lab 05/06/22 2130  AMMONIA 26     Coagulation Profile: Recent Labs  Lab 05/06/22 1915  INR 1.4*     Cardiac Enzymes: Recent Labs  Lab 05/06/22 1915  CKTOTAL 116     BNP (last 3 results) No results for input(s): PROBNP in the last 8760 hours.  Lipid Profile: No results for input(s): CHOL, HDL, LDLCALC, TRIG, CHOLHDL, LDLDIRECT in the last 72 hours.  Thyroid Function Tests: No results for input(s): TSH, T4TOTAL, FREET4, T3FREE, THYROIDAB in the last 72 hours.  Anemia Panel: No results for input(s): VITAMINB12, FOLATE, FERRITIN, TIBC, IRON, RETICCTPCT in the last 72 hours.  Urine analysis:    Component Value Date/Time   COLORURINE AMBER (A) 05/07/2022 0916   APPEARANCEUR CLOUDY (A) 05/07/2022 0916   LABSPEC 1.018 05/07/2022 0916   PHURINE 5.0 05/07/2022 0916   GLUCOSEU NEGATIVE 05/07/2022 0916   HGBUR LARGE (A) 05/07/2022 0916   BILIRUBINUR NEGATIVE 05/07/2022 0916   KETONESUR NEGATIVE 05/07/2022 0916   PROTEINUR 100 (A) 05/07/2022 0916   NITRITE NEGATIVE 05/07/2022 0916   LEUKOCYTESUR LARGE (A) 05/07/2022 0916    Sepsis Labs: Lactic Acid, Venous    Component Value Date/Time   LATICACIDVEN 1.6 05/08/2022 0725    MICROBIOLOGY: Recent  Results (from the past 240 hour(s))  Resp Panel by RT-PCR (Flu A&B, Covid) Nasopharyngeal Swab     Status: Abnormal   Collection Time: 05/06/22  7:04 PM   Specimen: Nasopharyngeal Swab; Nasopharyngeal(NP) swabs in vial transport medium  Result Value Ref Range Status   SARS Coronavirus 2 by RT PCR POSITIVE (A) NEGATIVE Final    Comment: (NOTE) SARS-CoV-2 target nucleic acids are DETECTED.  The SARS-CoV-2 RNA is generally detectable in upper respiratory specimens during the acute phase of infection. Positive results are indicative of the presence of the identified virus, but do not rule out bacterial infection or co-infection with other pathogens not detected by the test. Clinical correlation with patient history and other diagnostic information is necessary to determine patient infection status. The expected result is Negative.  Fact Sheet for Patients: EntrepreneurPulse.com.au  Fact Sheet for Healthcare Providers: IncredibleEmployment.be  This test is not yet approved or cleared by the [place] FDA and  has been authorized for detection and/or diagnosis of SARS-CoV-2 by FDA under an Emergency Use Authorization (EUA).  This EUA will remain in effect (meaning this test can be used) for the duration of  the COVID-19 declaration under Section 564(b)(1)  of the A ct, 21 U.S.C. section 360bbb-3(b)(1), unless the authorization is terminated or revoked sooner.     Influenza A by PCR NEGATIVE NEGATIVE Final   Influenza B by PCR NEGATIVE NEGATIVE Final    Comment: (NOTE) The Xpert Xpress SARS-CoV-2/FLU/RSV plus assay is intended as an aid in the diagnosis of influenza from Nasopharyngeal swab specimens and should not be used as a sole basis for treatment. Nasal washings and aspirates are unacceptable for Xpert Xpress SARS-CoV-2/FLU/RSV testing.  Fact Sheet for Patients: EntrepreneurPulse.com.au  Fact Sheet for Healthcare  Providers: IncredibleEmployment.be  This test is not yet approved or cleared by the [place] FDA and has been authorized for detection and/or diagnosis of SARS-CoV-2 by FDA under an Emergency Use Authorization (EUA). This EUA will remain in effect (meaning this test can be used) for the duration of the COVID-19 declaration under Section 564(b)(1) of the Act, 21 U.S.C. section 360bbb-3(b)(1), unless the authorization is terminated or revoked.  Performed at [facility], [place] [address] [place], [place] [address]   Culture, blood (routine x 2)     Status: None   Collection Time: 05/06/22  7:13 PM   Specimen: BLOOD RIGHT WRIST  Result Value Ref Range Status   Specimen Description BLOOD RIGHT WRIST  Final   Special Requests   Final    BOTTLES DRAWN AEROBIC AND ANAEROBIC Blood Culture results may not be optimal due to an inadequate volume of blood received in culture bottles   Culture   Final    NO GROWTH 5 DAYS Performed at [facility], [place] [address] [place], [place] [address]    Report Status 05/11/2022 FINAL  Final  Culture, blood (routine x 2)     Status: None   Collection Time: 05/06/22  7:15 PM   Specimen: Left Antecubital; Blood  Result Value Ref Range Status   Specimen Description LEFT ANTECUBITAL  Final   Special Requests   Final    BOTTLES DRAWN AEROBIC AND ANAEROBIC Blood Culture adequate volume   Culture   Final    NO GROWTH 5 DAYS Performed at [facility], [place] [address] [place], [place] [address]    Report Status 05/11/2022 FINAL  Final  Urine Culture     Status: None   Collection Time: 05/06/22  8:58 PM   Specimen: Urine, Clean Catch  Result Value Ref Range Status   Specimen Description URINE, CLEAN CATCH  Final   Special Requests NONE  Final   Culture   Final    NO GROWTH Performed at [facility], [place] [address] [place], [place] [address]    Report Status 05/08/2022 FINAL  Final  MRSA Next Gen by PCR,  Nasal     Status: None   Collection Time: 05/06/22 11:16 PM   Specimen: Nasal Mucosa; Nasal Swab  Result Value Ref Range Status   MRSA by PCR Next Gen NOT DETECTED NOT DETECTED Final    Comment: (NOTE) The GeneXpert MRSA Assay (FDA approved for NASAL specimens only), is one component of a comprehensive MRSA colonization surveillance program. It is not intended to diagnose MRSA infection nor to guide or monitor treatment for MRSA infections. Test performance is not FDA approved in patients less than 71 years old. Performed at [facility], [place] [address] [place], [place] [address]     RADIOLOGY STUDIES/RESULTS: DG Abd Portable 1V  Result Date: 05/10/2022 CLINICAL DATA:  Feeding tube placement. EXAM: PORTABLE ABDOMEN - 1  VIEW COMPARISON:  05/09/2022 FINDINGS: A small bore feeding tube is noted with tip overlying the peripyloric region. Bowel gas pattern is unremarkable. IMPRESSION: Small bore feeding tube with tip overlying the peripyloric region. Electronically Signed   By: Margarette Canada M.D.   On: 05/10/2022 14:49   ECHOCARDIOGRAM COMPLETE  Result Date: 05/10/2022    ECHOCARDIOGRAM REPORT   Patient Name:   BINH DOTEN [facility] Date of Exam: 05/10/2022 Medical Rec #:  [id number]       Height:       66.0 in Accession #:    [id number]      Weight:       239.2 lb Date of Birth:  [birth year]        BSA:          2.158 m Patient Age:    81 years        BP:           140/78 mmHg Patient Gender: M               HR:           96 bpm. Exam Location:  Inpatient Procedure: 2D Echo, Cardiac Doppler, Color Doppler and Intracardiac            Opacification Agent Indications:    AF  History:        Patient has no prior history of Echocardiogram examinations.                 Risk Factors:Hypertension.  Sonographer:    Luisa Hart RDCS Referring Phys: [place]  Sonographer Comments: Technically difficult study due to poor echo windows. IMPRESSIONS  1. Left ventricular ejection fraction, by  estimation, is 60 to 65%. The left ventricle has normal function. The left ventricle has no regional wall motion abnormalities. Left ventricular diastolic parameters are consistent with Grade I diastolic dysfunction (impaired relaxation).  2. Right ventricular systolic function is normal. The right ventricular size is normal. There is normal pulmonary artery systolic pressure. The estimated right ventricular systolic pressure is 85.8 mmHg.  3. The mitral valve is normal in structure. No evidence of mitral valve regurgitation. No evidence of mitral stenosis.  4. The aortic valve is tricuspid. Aortic valve regurgitation is not visualized. No aortic stenosis is present.  5. Aortic dilatation noted. There is mild dilatation of the aortic root, measuring 38 mm.  6. The inferior vena cava is normal in size with greater than 50% respiratory variability, suggesting right atrial pressure of 3 mmHg. FINDINGS  Left Ventricle: Left ventricular ejection fraction, by estimation, is 60 to 65%. The left ventricle has normal function. The left ventricle has no regional wall motion abnormalities. The left ventricular internal cavity size was normal in size. There is  no left ventricular hypertrophy. Left ventricular diastolic parameters are consistent with Grade I diastolic dysfunction (impaired relaxation). Right Ventricle: The right ventricular size is normal. No increase in right ventricular wall thickness. Right ventricular systolic function is normal. There is normal pulmonary artery systolic pressure. The tricuspid regurgitant velocity is 2.23 m/s, and  with an assumed right atrial pressure of 3 mmHg, the estimated right ventricular systolic pressure is 85.0 mmHg. Left Atrium: Left atrial size was normal in size. Right Atrium: Right atrial size was normal in size. Pericardium: There is no evidence of pericardial effusion. Mitral Valve: The mitral valve is normal in structure. Mild mitral annular calcification. No evidence of  mitral valve regurgitation. No evidence of mitral valve  stenosis. Tricuspid Valve: The tricuspid valve is normal in structure. Tricuspid valve regurgitation is trivial. Aortic Valve: The aortic valve is tricuspid. Aortic valve regurgitation is not visualized. No aortic stenosis is present. Aortic valve mean gradient measures 8.0 mmHg. Aortic valve peak gradient measures 13.2 mmHg. Pulmonic Valve: The pulmonic valve was normal in structure. Pulmonic valve regurgitation is not visualized. Aorta: Aortic dilatation noted. There is mild dilatation of the aortic root, measuring 38 mm. Venous: The inferior vena cava is normal in size with greater than 50% respiratory variability, suggesting right atrial pressure of 3 mmHg. IAS/Shunts: No atrial level shunt detected by color flow Doppler.  LEFT VENTRICLE PLAX 2D LVIDd:         3.00 cm     Diastology LVIDs:         2.80 cm     LV e' medial:    6.38 cm/s LV PW:         2.00 cm     LV E/e' medial:  12.4 LV IVS:        1.40 cm     LV e' lateral:   9.08 cm/s LVOT diam:     2.20 cm     LV E/e' lateral: 8.7 LVOT Area:     3.80 cm  LV Volumes (MOD) LV vol d, MOD A4C: 47.0 ml LV vol s, MOD A4C: 10.5 ml LV SV MOD A4C:     47.0 ml RIGHT VENTRICLE RV Basal diam:  2.60 cm RV Mid diam:    2.00 cm RV S prime:     18.30 cm/s TAPSE (M-mode): 1.9 cm LEFT ATRIUM           Index        RIGHT ATRIUM           Index LA diam:      3.20 cm 1.48 cm/m   RA Area:     17.10 cm LA Vol (A2C): 14.3 ml 6.63 ml/m   RA Volume:   36.70 ml  17.01 ml/m LA Vol (A4C): 35.2 ml 16.31 ml/m  AORTIC VALVE               PULMONIC VALVE AV Vmax:      182.00 cm/s  PV Vmax:       1.04 m/s AV Vmean:     125.550 cm/s PV Vmean:      75.500 cm/s AV VTI:       0.292 m      PV VTI:        0.178 m AV Peak Grad: 13.2 mmHg    PV Peak grad:  4.3 mmHg AV Mean Grad: 8.0 mmHg     PV Mean grad:  3.0 mmHg  AORTA Ao Root diam: 3.80 cm Ao Asc diam:  3.55 cm MITRAL VALVE                TRICUSPID VALVE MV Area (PHT): 3.53 cm     TR  Peak grad:   19.9 mmHg MV Decel Time: 215 msec     TR Vmax:        223.00 cm/s MV E velocity: 79.00 cm/s MV A velocity: 111.00 cm/s  SHUNTS MV E/A ratio:  0.71         Systemic Diam: 2.20 cm Dalton McleanMD Electronically signed by Franki Monte Signature Date/Time: 05/10/2022/3:20:48 PM    Final      LOS: 6 days   Oren Binet, MD  [facility]    To  contact the attending provider between 7A-7P or the covering provider during after hours 7P-7A, please log into the web site [email or web address] and access using universal [place] password for that web site. If you do not have the password, please call the hospital operator.  05/12/2022, 9:23 AM

## 2022-05-12 NOTE — TOC Initial Note (Signed)
Transition of Care [facility]) - Initial/Assessment Note    Patient Details  Name: Erik Turner MRN: [id number] Date of Birth: [birth year]  Transition of Care [facility]) CM/SW Contact:    Benard Halsted, [place] Phone Number: 05/12/2022, 4:10 PM  Clinical Narrative:                 CSW made [facility] aware that patient will likely be ready to return tomorrow. They requested CSW try to get insurance approval so CSW submitted clinicals, Ref# [id number].   Expected Discharge Plan: Skilled Nursing Facility Barriers to Discharge: Continued Medical Work up   Patient Goals and CMS Choice Patient states their goals for this hospitalization and ongoing recovery are:: Rehab CMS Medicare.gov Compare Post Acute Care list provided to:: Patient Choice offered to / list presented to : Spouse  Expected Discharge Plan and Services Expected Discharge Plan: [place] In-house Referral: Clinical Social Work   Post Acute Care Choice: [place] Living arrangements for the past 2 months: [place]                                      Prior Living Arrangements/Services Living arrangements for the past 2 months: [place] Lives with:: Facility Resident Patient language and need for interpreter reviewed:: Yes Do you feel safe going back to the place where you live?: Yes      Need for Family Participation in Patient Care: Yes (Comment) Care giver support system in place?: Yes (comment)   Criminal Activity/Legal Involvement Pertinent to Current Situation/Hospitalization: No - Comment as needed  Activities of Daily Living      Permission Sought/Granted Permission sought to share information with : Facility Sport and exercise psychologist, Family Supports Permission granted to share information with : No     Permission granted to share info w AGENCY: [facility] granted to share info w Relationship: Spouse     Emotional  Assessment Appearance:: Appears stated age Attitude/Demeanor/Rapport: Unable to Assess Affect (typically observed): Unable to Assess Orientation: : Oriented to Self, Oriented to Place Alcohol / Substance Use: Not Applicable Psych Involvement: No (comment)  Admission diagnosis:  Hyperkalemia [E87.5] AKI (acute kidney injury) ([place]) [N17.9] Septic shock ([place]) [A41.9, R65.21] Hypotension, unspecified hypotension type [I95.9] Leukocytosis, unspecified type [D72.829] Patient Active Problem List   Diagnosis Date Noted   Pressure injury of skin 05/07/2022   Septic shock ([place]) 05/06/2022   AKI (acute kidney injury) ([place])    Hyperkalemia    Hypotension    Leukocytosis    Hypomagnesemia 03/13/2022   Recurrent falls 03/11/2022   Homeless 03/11/2022   Hypokalemia 95/62/1308   Acute metabolic encephalopathy 65/78/4696   Acute kidney injury superimposed on CKD 3a 03/10/2022   Essential hypertension 03/10/2022   Diabetes mellitus type II, non insulin dependent ([place]) 03/10/2022   BPH (benign prostatic hyperplasia) 03/10/2022   Malignant neoplasm of prostate ([place]) 04/26/2017   PCP:  Merryl Hacker No Pharmacy:   [place], [place] - [address] [place] [address] Phone: [phone] Fax: [phone]  [facility] [address] [place] [address] Phone: [phone] Fax: [phone]     Social Determinants of Health (SDOH) Interventions    Readmission Risk Interventions    05/12/2022    4:08 PM  Readmission Risk Prevention Plan  Transportation Screening Complete  Medication Review (  RN Care Manager) Complete  PCP or Specialist appointment within 3-5 days of discharge Complete  HRI or Home Care Consult Complete  SW Recovery Care/Counseling Consult Complete  [place] Complete

## 2022-05-13 ENCOUNTER — Other Ambulatory Visit: Payer: Self-pay

## 2022-05-13 LAB — BASIC METABOLIC PANEL
Anion gap: 9 (ref 5–15)
BUN: 36 mg/dL — ABNORMAL HIGH (ref 8–23)
CO2: 27 mmol/L (ref 22–32)
Calcium: 8.5 mg/dL — ABNORMAL LOW (ref 8.9–10.3)
Chloride: 110 mmol/L (ref 98–111)
Creatinine, Ser: 1.29 mg/dL — ABNORMAL HIGH (ref 0.61–1.24)
GFR, Estimated: >60 mL/min (ref >60)
Glucose, Bld: 128 mg/dL — ABNORMAL HIGH (ref 70–99)
Potassium: 3.7 mmol/L (ref 3.5–5.1)
Sodium: 146 mmol/L — ABNORMAL HIGH (ref 135–145)

## 2022-05-13 LAB — GLUCOSE, CAPILLARY
Glucose-Capillary: 127 mg/dL — ABNORMAL HIGH (ref 70–99)
Glucose-Capillary: 141 mg/dL — ABNORMAL HIGH (ref 70–99)
Glucose-Capillary: 169 mg/dL — ABNORMAL HIGH (ref 70–99)
Glucose-Capillary: 119 mg/dL — ABNORMAL HIGH (ref 70–99)
Glucose-Capillary: 146 mg/dL — ABNORMAL HIGH (ref 70–99)

## 2022-05-13 LAB — MAGNESIUM: Magnesium: 1.6 mg/dL — ABNORMAL LOW (ref 1.7–2.4)

## 2022-05-13 MED ORDER — ATORVASTATIN CALCIUM 10 MG PO TABS
20.0000 mg | ORAL_TABLET | Freq: Every day | ORAL | Status: DC
  Administered 2022-05-13: 20 mg via ORAL
  Filled 2022-05-13: qty 2

## 2022-05-13 MED ORDER — MAGNESIUM SULFATE 4 GM/100ML IV SOLN
4.0000 g | Freq: Once | INTRAVENOUS | Status: AC
  Administered 2022-05-13: 4 g via INTRAVENOUS
  Filled 2022-05-13: qty 100

## 2022-05-13 MED ORDER — SODIUM CHLORIDE 0.9 % IV SOLN: INTRAVENOUS | Status: DC

## 2022-05-13 MED ORDER — SODIUM CHLORIDE 0.45 % IV SOLN: INTRAVENOUS | Status: AC

## 2022-05-13 MED ORDER — QUETIAPINE FUMARATE 50 MG PO TABS
50.0000 mg | ORAL_TABLET | Freq: Every day | ORAL | Status: DC
  Administered 2022-05-13: 50 mg via ORAL
  Filled 2022-05-13: qty 1

## 2022-05-13 MED ORDER — MELATONIN 5 MG PO TABS
5.0000 mg | ORAL_TABLET | Freq: Every day | ORAL | Status: DC
  Administered 2022-05-13: 5 mg via ORAL
  Filled 2022-05-13: qty 1

## 2022-05-13 NOTE — Plan of Care (Signed)
  Problem: Education: Goal: Knowledge of General Education information will improve Description: Including pain rating scale, medication(s)/side effects and non-pharmacologic comfort measures Outcome: Progressing   Problem: Activity: Goal: Risk for activity intolerance will decrease Outcome: Progressing   Problem: Safety: Goal: Ability to remain free from injury will improve Outcome: Progressing   Problem: Safety: Goal: Non-violent Restraint(s) Outcome: Completed/Met

## 2022-05-13 NOTE — Plan of Care (Signed)
  Problem: Nutrition: Goal: Adequate nutrition will be maintained Outcome: Progressing   Problem: Elimination: Goal: Will not experience complications related to bowel motility Outcome: Progressing   Problem: Pain Managment: Goal: General experience of comfort will improve Outcome: Progressing   

## 2022-05-13 NOTE — Progress Notes (Signed)
PROGRESS NOTE        PATIENT DETAILS Name: Erik Turner Age: 66 y.o. Sex: male Date of Birth: 1956-10-19 Admit Date: 05/06/2022 Admitting Physician Laurin Coder, MD PCP:Pcp, No  Brief Summary: Patient is a 66 y.o.  male with history of HTN, HLD, DM-2, s/p colectomy-who was found on the floor at his SNF-he was hypotensive-he was found to have AKI with a creatinine of around 14-he was subsequently admitted to the [place] with supportive care-and upon further improvement-transferred to [facility] service.   Significant events: 5/20>> hypotensive/encephalopathic-AKI-to ICU. 5/24>> transfer to [facility]  Significant studies: 5/20>> CT head: No acute intracranial abnormality. 5/20>>CT C-spine: Osteopenia/degenerative changes-no fracture/malalignment 5/20>> CT abdomen/pelvis: No acute findings. 5/25>> Echo: EF 01-02%, grade 1 diastolic dysfunction.  Significant microbiology data: 5/20>> blood culture: No growth 5/20>> urine culture: No growth 5/20>> COVID PCR: Positive  Procedures: None  Consults: Renal, PCCM  Subjective: Had some delirium last night requiring initiation of soft restraints for a few hours.  Awake and alert this morning.  NG tube/Foley catheter removed-apparently has voided spontaneously.  Objective: Vitals: Blood pressure (!) 141/94, pulse 68, temperature 98.1 F (36.7 C), temperature source Oral, resp. rate 19, height '5\' 6"'$  (1.676 m), weight 101.8 kg, SpO2 92 %.   Exam: Gen Exam:Alert awake-not in any distress HEENT:atraumatic, normocephalic Chest: B/L clear to auscultation anteriorly CVS:S1S2 regular Abdomen:soft non tender, non distended Extremities:no edema Neurology: Non focal Skin: no rash   Pertinent Labs/Radiology:    Latest Ref Rng & Units 05/12/2022    5:02 AM 05/11/2022    4:42 AM 05/10/2022    8:06 AM  CBC  WBC 4.0 - 10.5 K/uL 5.9   4.1   4.9    Hemoglobin 13.0 - 17.0 g/dL 10.5   9.8   10.1    Hematocrit 39.0 -  52.0 % 32.1   30.8   30.3    Platelets 150 - 400 K/uL 112   108   94      Lab Results  Component Value Date   NA 146 (H) 05/13/2022   K 3.7 05/13/2022   CL 110 05/13/2022   CO2 27 05/13/2022       Assessment/Plan: AKI: Due to ATN in the setting of hypotension/COVID-19 infection.  Managed with supportive care-with significant improvement in renal function.  Severe hyperkalemia: In the setting of AKI/losartan use-resolved.  Severe metabolic acidosis: Due to AKI-resolved  Hypernatremia: Very mild hyponatremia persists-encourage oral intake-we will hydrate with IVF for a few hours today.  Hypotension-due to combination of hypovolemia and septic shock from COVID-19 infection: Resolved-all cultures negative to date.  BP stable.  Acute metabolic encephalopathy: Due to AKI/COVID-19 infection-mentation has markedly improved-awake and alert this morning.  Continues to have some amount of sundowning/delirium at night-increase Seroquel to 50 mg nightly.  Minimize use of restraints is much as possible.  Dysphagia: In the setting of encephalopathy-cortrak tube removed on 5/26-tolerating diet well.  PAF with RVR: Maintaining sinus rhythm-had a very brief episode of RVR while in the ICU-required IV amiodarone.  Echo with stable EF-TSH normal on 5/20.  Since very brief-unprovoked by acute illness-avoiding anticoagulation (has ongoing thrombocytopenia as well)  Thrombocytopenia: Probably related to sepsis physiology/COVID-19 infection-platelet count still low but stable and increasing slowly.    Normocytic anemia: Due to acute illness/AKI-follow CBC.  DM-2 (A1c 6.6 on 5/20): CBGs remain stable-continue  Semglee 10 units daily and SSI.  Recent Labs    05/12/22 1710 05/12/22 2029 05/13/22 0907  GLUCAP 156* 122* 127*     HLD: Resume statin  HTN: BP stable-continue metoprolol.  History of colostomy  Nutrition Status: Nutrition Problem: Increased nutrient needs Etiology: acute illness  (COVID-19) Signs/Symptoms: estimated needs Interventions: Ensure Enlive (each supplement provides 350kcal and 20 grams of protein), MVI, Prostat, Tube feeding    Pressure Ulcer: Pressure Injury 05/06/22 Buttocks Right Stage 1 -  Intact skin with non-blanchable redness of a localized area usually over a bony prominence. (Active)  05/06/22   Location: Buttocks  Location Orientation: Right  Staging: Stage 1 -  Intact skin with non-blanchable redness of a localized area usually over a bony prominence.  Wound Description (Comments):   Present on Admission: Yes  Dressing Type Foam - Lift dressing to assess site every shift 05/12/22 2016     Pressure Injury 05/07/22 Thigh Right;Lateral Stage 1 -  Intact skin with non-blanchable redness of a localized area usually over a bony prominence. (Active)  05/07/22   Location: Thigh  Location Orientation: Right;Lateral  Staging: Stage 1 -  Intact skin with non-blanchable redness of a localized area usually over a bony prominence.  Wound Description (Comments):   Present on Admission: Yes  Dressing Type None 05/12/22 2016     Pressure Injury 05/06/22 Buttocks Right;Medial Stage 1 -  Intact skin with non-blanchable redness of a localized area usually over a bony prominence. (Active)  05/06/22   Location: Buttocks  Location Orientation: Right;Medial  Staging: Stage 1 -  Intact skin with non-blanchable redness of a localized area usually over a bony prominence.  Wound Description (Comments):   Present on Admission: Yes  Dressing Type Foam - Lift dressing to assess site every shift 05/12/22 2016     Pressure Injury 05/06/22 Buttocks Right;Lateral Stage 1 -  Intact skin with non-blanchable redness of a localized area usually over a bony prominence. (Active)  05/06/22   Location: Buttocks  Location Orientation: Right;Lateral  Staging: Stage 1 -  Intact skin with non-blanchable redness of a localized area usually over a bony prominence.  Wound  Description (Comments):   Present on Admission: Yes  Dressing Type Foam - Lift dressing to assess site every shift 05/12/22 2016     Pressure Injury 02/06/22 Flank Right;Lateral Stage 1 -  Intact skin with non-blanchable redness of a localized area usually over a bony prominence. (Active)  02/06/22   Location: Flank  Location Orientation: Right;Lateral  Staging: Stage 1 -  Intact skin with non-blanchable redness of a localized area usually over a bony prominence.  Wound Description (Comments):   Present on Admission: Yes  Dressing Type None 05/12/22 2016   Obesity: Estimated body mass index is 36.22 kg/m as calculated from the following:   Height as of this encounter: '5\' 6"'$  (1.676 m).   Weight as of this encounter: 101.8 kg.   Code status:   Code Status: Full Code   DVT Prophylaxis: heparin injection 5,000 Units Start: 05/06/22 2200   Family Communication: Spouse-Helen-[phone]-left VM on 5/25   Disposition Plan: Status is: Inpatient Remains inpatient appropriate because:    Planned Discharge Destination:Skilled nursing facility   Diet: Diet Order             DIET DYS 3 Room service appropriate? Yes; Fluid consistency: Thin  Diet effective now  Antimicrobial agents: Anti-infectives (From admission, onward)    Start     Dose/Rate Route Frequency Ordered Stop   05/07/22 2200  ceFEPIme (MAXIPIME) 1 g in sodium chloride 0.9 % 100 mL IVPB  Status:  Discontinued        1 g 200 mL/hr over 30 Minutes Intravenous Every 24 hours 05/06/22 2117 05/08/22 0944   05/06/22 2117  vancomycin variable dose per unstable renal function (pharmacist dosing)  Status:  Discontinued         Does not apply See admin instructions 05/06/22 2117 05/08/22 0944   05/06/22 1915  metroNIDAZOLE (FLAGYL) IVPB 500 mg        500 mg 100 mL/hr over 60 Minutes Intravenous  Once 05/06/22 1904 05/06/22 2031   05/06/22 1915  vancomycin (VANCOREADY) IVPB 1750 mg/350 mL         1,750 mg 175 mL/hr over 120 Minutes Intravenous  Once 05/06/22 1912 05/06/22 2236   05/06/22 1915  ceFEPIme (MAXIPIME) 2 g in sodium chloride 0.9 % 100 mL IVPB        2 g 200 mL/hr over 30 Minutes Intravenous  Once 05/06/22 1912 05/06/22 2031        MEDICATIONS: Scheduled Meds:  chlorhexidine  15 mL Mouth Rinse BID   Chlorhexidine Gluconate Cloth  6 each Topical Daily   feeding supplement  237 mL Oral TID BM   feeding supplement (PROSource TF)  45 mL Per Tube TID   heparin  5,000 Units Subcutaneous Q8H   insulin aspart  0-9 Units Subcutaneous TID WC   insulin glargine-yfgn  10 Units Subcutaneous Daily   mouth rinse  15 mL Mouth Rinse q12n4p   melatonin  5 mg Oral QHS   metoprolol tartrate  25 mg Oral BID   multivitamin with minerals  1 tablet Per Tube Daily   nystatin cream   Topical TID   QUEtiapine  50 mg Oral QHS   tamsulosin  0.4 mg Oral Daily   Continuous Infusions:  sodium chloride     feeding supplement (OSMOLITE 1.2 CAL) Stopped (05/12/22 1530)   magnesium sulfate bolus IVPB 4 g (05/13/22 1042)   PRN Meds:.acetaminophen, docusate sodium, haloperidol lactate, levalbuterol, polyethylene glycol   I have personally reviewed following labs and imaging studies  LABORATORY DATA: CBC: Recent Labs  Lab 05/06/22 1915 05/06/22 1955 05/08/22 0030 05/09/22 0634 05/10/22 0806 05/11/22 0442 05/12/22 0502  WBC 31.6*   < > 9.6 5.7 4.9 4.1 5.9  NEUTROABS 27.9*  --   --   --   --   --   --   HGB 13.4   < > 10.4* 9.5* 10.1* 9.8* 10.5*  HCT 39.1   < > 28.4* 27.2* 30.3* 30.8* 32.1*  MCV 88.7   < > 82.3 85.0 89.4 91.9 91.7  PLT 299   < > 132* 104* 94* 108* 112*   < > = values in this interval not displayed.     Basic Metabolic Panel: Recent Labs  Lab 05/06/22 2130 05/06/22 2221 05/08/22 0030 05/09/22 7858 05/09/22 1631 05/10/22 0806 05/11/22 0442 05/12/22 0502 05/13/22 0705  NA  --    < > 135 136 139 144 148* 146* 146*  K  --    < > 3.4* 3.0* 3.4* 3.6 3.6 3.5  3.7  CL  --    < > 96* 90* 93* 101 106 110 110  CO2  --    < > 16* 30 32 32 37* 30 27  GLUCOSE  --    < >  133* 243* 252* 147* 140* 153* 128*  BUN  --    < > 130* 115* 106* 81* 55* 35* 36*  CREATININE 13.53*   < > 10.10* 4.46* 3.18* 1.80* 1.29* 1.12 1.29*  CALCIUM  --    < > 7.5* 7.9* 8.2* 8.4* 8.6* 8.5* 8.5*  MG 1.8  --  1.4* 2.1  --  1.8  --  1.2* 1.6*  PHOS 9.0*  --   --   --   --   --   --   --   --    < > = values in this interval not displayed.     GFR: Estimated Creatinine Clearance: 62.1 mL/min (A) (by C-G formula based on SCr of 1.29 mg/dL (H)).  Liver Function Tests: Recent Labs  Lab 05/06/22 1915 05/11/22 0442  AST 19 36  ALT 20 20  ALKPHOS 68 41  BILITOT 0.6 0.5  PROT 6.6 5.4*  ALBUMIN 3.5 2.5*    No results for input(s): LIPASE, AMYLASE in the last 168 hours. Recent Labs  Lab 05/06/22 2130  AMMONIA 26     Coagulation Profile: Recent Labs  Lab 05/06/22 1915  INR 1.4*     Cardiac Enzymes: Recent Labs  Lab 05/06/22 1915  CKTOTAL 116     BNP (last 3 results) No results for input(s): PROBNP in the last 8760 hours.  Lipid Profile: No results for input(s): CHOL, HDL, LDLCALC, TRIG, CHOLHDL, LDLDIRECT in the last 72 hours.  Thyroid Function Tests: No results for input(s): TSH, T4TOTAL, FREET4, T3FREE, THYROIDAB in the last 72 hours.  Anemia Panel: No results for input(s): VITAMINB12, FOLATE, FERRITIN, TIBC, IRON, RETICCTPCT in the last 72 hours.  Urine analysis:    Component Value Date/Time   COLORURINE AMBER (A) 05/07/2022 0916   APPEARANCEUR CLOUDY (A) 05/07/2022 0916   LABSPEC 1.018 05/07/2022 0916   PHURINE 5.0 05/07/2022 0916   GLUCOSEU NEGATIVE 05/07/2022 0916   HGBUR LARGE (A) 05/07/2022 0916   BILIRUBINUR NEGATIVE 05/07/2022 0916   KETONESUR NEGATIVE 05/07/2022 0916   PROTEINUR 100 (A) 05/07/2022 0916   NITRITE NEGATIVE 05/07/2022 0916   LEUKOCYTESUR LARGE (A) 05/07/2022 0916    Sepsis Labs: Lactic Acid, Venous     Component Value Date/Time   LATICACIDVEN 1.6 05/08/2022 0725    MICROBIOLOGY: Recent Results (from the past 240 hour(s))  Resp Panel by RT-PCR (Flu A&B, Covid) Nasopharyngeal Swab     Status: Abnormal   Collection Time: 05/06/22  7:04 PM   Specimen: Nasopharyngeal Swab; Nasopharyngeal(NP) swabs in vial transport medium  Result Value Ref Range Status   SARS Coronavirus 2 by RT PCR POSITIVE (A) NEGATIVE Final    Comment: (NOTE) SARS-CoV-2 target nucleic acids are DETECTED.  The SARS-CoV-2 RNA is generally detectable in upper respiratory specimens during the acute phase of infection. Positive results are indicative of the presence of the identified virus, but do not rule out bacterial infection or co-infection with other pathogens not detected by the test. Clinical correlation with patient history and other diagnostic information is necessary to determine patient infection status. The expected result is Negative.  Fact Sheet for Patients: EntrepreneurPulse.com.au  Fact Sheet for Healthcare Providers: IncredibleEmployment.be  This test is not yet approved or cleared by the [place] FDA and  has been authorized for detection and/or diagnosis of SARS-CoV-2 by FDA under an Emergency Use Authorization (EUA).  This EUA will remain in effect (meaning this test can be used) for the duration of  the COVID-19 declaration under  Section 564(b)(1) of the A ct, 21 U.S.C. section 360bbb-3(b)(1), unless the authorization is terminated or revoked sooner.     Influenza A by PCR NEGATIVE NEGATIVE Final   Influenza B by PCR NEGATIVE NEGATIVE Final    Comment: (NOTE) The Xpert Xpress SARS-CoV-2/FLU/RSV plus assay is intended as an aid in the diagnosis of influenza from Nasopharyngeal swab specimens and should not be used as a sole basis for treatment. Nasal washings and aspirates are unacceptable for Xpert Xpress SARS-CoV-2/FLU/RSV testing.  Fact Sheet  for Patients: EntrepreneurPulse.com.au  Fact Sheet for Healthcare Providers: IncredibleEmployment.be  This test is not yet approved or cleared by the [place] FDA and has been authorized for detection and/or diagnosis of SARS-CoV-2 by FDA under an Emergency Use Authorization (EUA). This EUA will remain in effect (meaning this test can be used) for the duration of the COVID-19 declaration under Section 564(b)(1) of the Act, 21 U.S.C. section 360bbb-3(b)(1), unless the authorization is terminated or revoked.  Performed at [facility], [place] [address], [place], [place] [address]   Culture, blood (routine x 2)     Status: None   Collection Time: 05/06/22  7:13 PM   Specimen: BLOOD RIGHT WRIST  Result Value Ref Range Status   Specimen Description BLOOD RIGHT WRIST  Final   Special Requests   Final    BOTTLES DRAWN AEROBIC AND ANAEROBIC Blood Culture results may not be optimal due to an inadequate volume of blood received in culture bottles   Culture   Final    NO GROWTH 5 DAYS Performed at [facility], [place] [address] [place], [place] [address]    Report Status 05/11/2022 FINAL  Final  Culture, blood (routine x 2)     Status: None   Collection Time: 05/06/22  7:15 PM   Specimen: Left Antecubital; Blood  Result Value Ref Range Status   Specimen Description LEFT ANTECUBITAL  Final   Special Requests   Final    BOTTLES DRAWN AEROBIC AND ANAEROBIC Blood Culture adequate volume   Culture   Final    NO GROWTH 5 DAYS Performed at [facility], [place] [address], [place], [place] [address]    Report Status 05/11/2022 FINAL  Final  Urine Culture     Status: None   Collection Time: 05/06/22  8:58 PM   Specimen: Urine, Clean Catch  Result Value Ref Range Status   Specimen Description URINE, CLEAN CATCH  Final   Special Requests NONE  Final   Culture   Final    NO GROWTH Performed at [facility], [place] [address]  [place], [place] [address]    Report Status 05/08/2022 FINAL  Final  MRSA Next Gen by PCR, Nasal     Status: None   Collection Time: 05/06/22 11:16 PM   Specimen: Nasal Mucosa; Nasal Swab  Result Value Ref Range Status   MRSA by PCR Next Gen NOT DETECTED NOT DETECTED Final    Comment: (NOTE) The GeneXpert MRSA Assay (FDA approved for NASAL specimens only), is one component of a comprehensive MRSA colonization surveillance program. It is not intended to diagnose MRSA infection nor to guide or monitor treatment for MRSA infections. Test performance is not FDA approved in patients less than 68 years old. Performed at [facility], [place] [address] [place], [place] [address]     RADIOLOGY STUDIES/RESULTS: No results found.   LOS: 7 days   Oren Binet, MD  [facility]  To contact the attending provider between 7A-7P or the covering provider during after hours 7P-7A, please log into the web site [email or web address] and access using universal [place] password for that web site. If you do not have the password, please call the hospital operator.  05/13/2022, 10:58 AM

## 2022-05-14 DIAGNOSIS — E119 Type 2 diabetes mellitus without complications: Secondary | ICD-10-CM

## 2022-05-14 LAB — CBC
HCT: 31.9 % — ABNORMAL LOW (ref 39.0–52.0)
Hemoglobin: 10.2 g/dL — ABNORMAL LOW (ref 13.0–17.0)
MCH: 29.4 pg (ref 26.0–34.0)
MCHC: 32 g/dL (ref 30.0–36.0)
MCV: 91.9 fL (ref 80.0–100.0)
Platelets: 120 10*3/uL — ABNORMAL LOW (ref 150–400)
RBC: 3.47 MIL/uL — ABNORMAL LOW (ref 4.22–5.81)
RDW: 13.2 % (ref 11.5–15.5)
WBC: 8.4 10*3/uL (ref 4.0–10.5)
nRBC: 0 % (ref 0.0–0.2)

## 2022-05-14 LAB — BASIC METABOLIC PANEL
Anion gap: 7 (ref 5–15)
BUN: 43 mg/dL — ABNORMAL HIGH (ref 8–23)
CO2: 27 mmol/L (ref 22–32)
Calcium: 8.6 mg/dL — ABNORMAL LOW (ref 8.9–10.3)
Chloride: 110 mmol/L (ref 98–111)
Creatinine, Ser: 1.42 mg/dL — ABNORMAL HIGH (ref 0.61–1.24)
GFR, Estimated: 54 mL/min — ABNORMAL LOW (ref >60)
Glucose, Bld: 114 mg/dL — ABNORMAL HIGH (ref 70–99)
Potassium: 3.6 mmol/L (ref 3.5–5.1)
Sodium: 144 mmol/L (ref 135–145)

## 2022-05-14 LAB — GLUCOSE, CAPILLARY: Glucose-Capillary: 96 mg/dL (ref 70–99)

## 2022-05-14 MED ORDER — INSULIN ASPART 100 UNIT/ML IJ SOLN: INTRAMUSCULAR | 11 refills | Status: DC

## 2022-05-14 MED ORDER — ENSURE ENLIVE PO LIQD: 237.0000 mL | Freq: Three times a day (TID) | ORAL | 12 refills | Status: DC

## 2022-05-14 MED ORDER — GERHARDT'S BUTT CREAM
TOPICAL_CREAM | Freq: Two times a day (BID) | CUTANEOUS | Status: DC
  Filled 2022-05-14: qty 1

## 2022-05-14 MED ORDER — GERHARDT'S BUTT CREAM: 1.0000 "application " | TOPICAL_CREAM | Freq: Two times a day (BID) | CUTANEOUS | Status: DC

## 2022-05-14 MED ORDER — NYSTATIN 100000 UNIT/GM EX CREA: TOPICAL_CREAM | Freq: Three times a day (TID) | CUTANEOUS | 0 refills | Status: AC

## 2022-05-14 MED ORDER — QUETIAPINE FUMARATE 50 MG PO TABS: 50.0000 mg | ORAL_TABLET | Freq: Every day | ORAL | Status: DC

## 2022-05-14 MED ORDER — TAMSULOSIN HCL 0.4 MG PO CAPS: 0.4000 mg | ORAL_CAPSULE | Freq: Every day | ORAL | Status: DC

## 2022-05-14 MED ORDER — MELATONIN 5 MG PO TABS: 5.0000 mg | ORAL_TABLET | Freq: Every day | ORAL | 0 refills | Status: DC

## 2022-05-14 MED ORDER — METOPROLOL TARTRATE 25 MG PO TABS: 25.0000 mg | ORAL_TABLET | Freq: Two times a day (BID) | ORAL | Status: DC

## 2022-05-14 NOTE — Consult Note (Addendum)
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  Patient is not able to care for his ostomy at this time.  My associate, D. Barbie Haggis saw this patient 6 days ago on 5/22 and noted the peristomal skin was intact.  Stomal assessment/size: 3/4 inches, flush with skin level.  Consulted for peristomal skin complication (irritant contact dermatitis) around ileostomy.   Problem:  irritant contact dermatitis  ICD-10 CM Codes for Irritant Dermatitis  L24B1 - Related to digestive stoma or fistula  Bedside RN is provided with instructions for the care of the peristomal skin as a Care Order/Instruction in the Orders. They are also below:  Skin care to peristomal skin if red or broken (Irritant contact dermatitis related to digestive stoma):    1. Remove old pouch if leaking. Do not reinforce with tape. 2. Cleanse skin with warm water 3. Pat gently dry. 4. Sprinkle ostomy powder (Stoma powder is Lawson # 6) onto skin and brush away excess, leaving only a small amount of powder on the skin. 5. Stretch ostomy ring slightly and place around stoma (should fit snugly). Ostomy ring is Kellie Simmering # [id number]. 6. Cut pouch aperture to 3/4 inch so that skin barrier fits closely around stoma. Pouch Is Kellie Simmering # [id number] 7. Replace ostomy pouch. Cover pouching system with hand for approximately 1 minute to gently warm adhesives 8. Ensure tail closure of ostomy pouch is closed. 9. Empty pouch when 1/3 to 1/2 full.   [facility] nursing team will not follow, but will remain available to this patient, the nursing and medical teams.  Please re-consult if needed. Thanks, Maudie Flakes, MSN, RN, [place], Arther Abbott  Pager# [phone]

## 2022-05-14 NOTE — TOC Transition Note (Signed)
Transition of Care [facility]) - CM/SW Discharge Note   Patient Details  Name: Erik Turner MRN: [id number] Date of Birth: 1956-03-04  Transition of Care [facility] CM/SW Contact:  Ina Homes, Miranda Phone Number: 05/14/2022, 9:08 AM   Clinical Narrative:     Per MD, pt medically ready for d/c today.   SW spoke with [facility] [phone]) confirmed able to accept pt. Call report :[phone]  [facility] called   Final next level of care: Skilled Nursing Facility Barriers to Discharge: Barriers Resolved   Patient Goals and CMS Choice Patient states their goals for this hospitalization and ongoing recovery are:: Rehab CMS Medicare.gov Compare Post Acute Care list provided to:: Patient Choice offered to / list presented to : Spouse  Discharge Placement              Patient chooses bed at:  [facility]) Patient to be transferred to facility by: [facility]   Patient and family notified of of transfer: 05/14/22  Discharge Plan and Services In-house Referral: Clinical Social Work   Post Acute Care Choice: [place]                               Social Determinants of Health (SDOH) Interventions     Readmission Risk Interventions    05/12/2022    4:08 PM  Readmission Risk Prevention Plan  Transportation Screening Complete  Medication Review [employer]) Complete  PCP or Specialist appointment within 3-5 days of discharge Complete  HRI or Home Care Consult Complete  SW Recovery Care/Counseling Consult Complete  [place] Complete

## 2022-05-14 NOTE — Discharge Summary (Signed)
PATIENT DETAILS Name: Erik Turner Age: 66 y.o. Sex: male Date of Birth: [birth year] MRN: [id number]. Admitting Physician: Laurin Coder, MD PCP:Pcp, No  Admit Date: 05/06/2022 Discharge date: 05/14/2022  Recommendations for Outpatient Follow-up:  Follow up with PCP in 1-2 weeks Please obtain CMP/CBC in one week  Admitted From:  SNF  Disposition: Skilled nursing facility   Discharge Condition: good  CODE STATUS:   Code Status: Full Code   Diet recommendation:  Diet Order             Diet - low sodium heart healthy           Diet Carb Modified           DIET DYS 3 Room service appropriate? Yes; Fluid consistency: Thin  Diet effective now                    Brief Summary: Patient is a 66 y.o.  male with history of HTN, HLD, DM-2, s/p colectomy-who was found on the floor at his SNF-he was hypotensive-he was found to have AKI with a creatinine of around 14-he was subsequently admitted to the [facility] with supportive care-and upon further improvement-transferred to [facility] service.     Significant events: 5/20>> hypotensive/encephalopathic-AKI-to ICU. 5/24>> transfer to [facility]   Significant studies: 5/20>> CT head: No acute intracranial abnormality. 5/20>>CT C-spine: Osteopenia/degenerative changes-no fracture/malalignment 5/20>> CT abdomen/pelvis: No acute findings. 5/25>> Echo: EF 26-37%, grade 1 diastolic dysfunction.   Significant microbiology data: 5/20>> blood culture: No growth 5/20>> urine culture: No growth 5/20>> COVID PCR: Positive   Procedures: None   Consults: Renal, PCCM    Brief Hospital Course: AKI: Due to ATN in the setting of hypotension/COVID-19 infection.  Managed with supportive care-with significant improvement in renal function. electrolytes in 1 week.   Severe hyperkalemia: In the setting of AKI/losartan use-resolved.   Severe metabolic acidosis: Due to AKI-resolved   Hypernatremia: Resolved with supportive care.    Hypotension-due to combination of hypovolemia and septic shock from COVID-19 infection: Resolved-all cultures negative to date.  BP stable.   Acute metabolic encephalopathy: Due to AKI/COVID-19 infection-mentation has markedly improved-awake and alert this morning.  Did have some delirium-mostly sundowning at night-stable on Seroquel.  No longer on restraints.     Dysphagia: In the setting of encephalopathy-cortrak tube removed on 5/26-tolerating dysphagia 3 diet well.   PAF with RVR: Maintaining sinus rhythm-had a very brief episode of RVR while in the ICU-required IV amiodarone.  Echo with stable EF-TSH normal on 5/20. Since very brief-unprovoked by acute illness-avoiding anticoagulation (has ongoing thrombocytopenia as well)   Thrombocytopenia: Probably related to sepsis physiology/COVID-19 infection-platelet count still low but stable and increasing slowly.     Normocytic anemia: Due to acute illness/AKI-follow CBC.   DM-2 (A1c 6.6 on 5/20): CBGs remain stable-resume metformin-continue SSI.  Further optimization deferred to attending MD at SNF.   HLD: Continue statin   HTN: BP stable-continue metoprolol.   History of colostomy   Nutrition Status: Nutrition Problem: Increased nutrient needs Etiology: acute illness (COVID-19) Signs/Symptoms: estimated needs Interventions: Ensure Enlive (each supplement provides 350kcal and 20 grams of protein), MVI, Prostat, Tube feeding  Pressure Ulcer: Pressure Injury 05/06/22 Buttocks Right Stage 1 -  Intact skin with non-blanchable redness of a localized area usually over a bony prominence. (Active)  05/06/22   Location: Buttocks  Location Orientation: Right  Staging: Stage 1 -  Intact skin with non-blanchable redness of a localized area usually over a bony  prominence.  Wound Description (Comments):   Present on Admission: Yes  Dressing Type Foam - Lift dressing to assess site every shift 05/13/22 2026     Pressure Injury 05/07/22 Thigh  Right;Lateral Stage 1 -  Intact skin with non-blanchable redness of a localized area usually over a bony prominence. (Active)  05/07/22   Location: Thigh  Location Orientation: Right;Lateral  Staging: Stage 1 -  Intact skin with non-blanchable redness of a localized area usually over a bony prominence.  Wound Description (Comments):   Present on Admission: Yes  Dressing Type None 05/13/22 2026     Pressure Injury 05/06/22 Buttocks Right;Medial Stage 1 -  Intact skin with non-blanchable redness of a localized area usually over a bony prominence. (Active)  05/06/22   Location: Buttocks  Location Orientation: Right;Medial  Staging: Stage 1 -  Intact skin with non-blanchable redness of a localized area usually over a bony prominence.  Wound Description (Comments):   Present on Admission: Yes  Dressing Type Foam - Lift dressing to assess site every shift 05/13/22 2026     Pressure Injury 05/06/22 Buttocks Right;Lateral Stage 1 -  Intact skin with non-blanchable redness of a localized area usually over a bony prominence. (Active)  05/06/22   Location: Buttocks  Location Orientation: Right;Lateral  Staging: Stage 1 -  Intact skin with non-blanchable redness of a localized area usually over a bony prominence.  Wound Description (Comments):   Present on Admission: Yes  Dressing Type Foam - Lift dressing to assess site every shift 05/13/22 2026     Pressure Injury 02/06/22 Flank Right;Lateral Stage 1 -  Intact skin with non-blanchable redness of a localized area usually over a bony prominence. (Active)  02/06/22   Location: Flank  Location Orientation: Right;Lateral  Staging: Stage 1 -  Intact skin with non-blanchable redness of a localized area usually over a bony prominence.  Wound Description (Comments):   Present on Admission: Yes  Dressing Type None 05/13/22 2026     Nutrition Status: Nutrition Problem: Increased nutrient needs Etiology: acute illness (COVID-19) Signs/Symptoms:  estimated needs Interventions: Ensure Enlive (each supplement provides 350kcal and 20 grams of protein), MVI, Prostat, Tube feeding    Obesity: Estimated body mass index is 36.37 kg/m as calculated from the following:   Height as of this encounter: '5\' 6"'$  (1.676 m).   Weight as of this encounter: 102.2 kg.   Discharge Diagnoses:  Principal Problem:   Septic shock ([place]) Active Problems:   Pressure injury of skin   Discharge Instructions:  Activity:  As tolerated with Full fall precautions use walker/cane & assistance as needed   Discharge Instructions     Call MD for:  difficulty breathing, headache or visual disturbances   Complete by: As directed    Diet - low sodium heart healthy   Complete by: As directed    Diet Carb Modified   Complete by: As directed    Discharge instructions   Complete by: As directed    Follow with Primary MD in 1-2 weeks  Please check CBGs before meals and at bedtime.  Please get a complete blood count and chemistry panel checked by your Primary MD at your next visit, and again as instructed by your Primary MD.  Get Medicines reviewed and adjusted: Please take all your medications with you for your next visit with your Primary MD  Laboratory/radiological data: Please request your Primary MD to go over all hospital tests and procedure/radiological results at the follow up, please ask  your Primary MD to get all Hospital records sent to his/her office.  In some cases, they will be blood work, cultures and biopsy results pending at the time of your discharge. Please request that your primary care M.D. follows up on these results.  Also Note the following: If you experience worsening of your admission symptoms, develop shortness of breath, life threatening emergency, suicidal or homicidal thoughts you must seek medical attention immediately by calling 911 or calling your MD immediately  if symptoms less severe.  You must read complete  instructions/literature along with all the possible adverse reactions/side effects for all the Medicines you take and that have been prescribed to you. Take any new Medicines after you have completely understood and accpet all the possible adverse reactions/side effects.   Do not drive when taking Pain medications or sleeping medications (Benzodaizepines)  Do not take more than prescribed Pain, Sleep and Anxiety Medications. It is not advisable to combine anxiety,sleep and pain medications without talking with your primary care practitioner  Special Instructions: If you have smoked or chewed Tobacco  in the last 2 yrs please stop smoking, stop any regular Alcohol  and or any Recreational drug use.  Wear Seat belts while driving.  Please note: You were cared for by a hospitalist during your hospital stay. Once you are discharged, your primary care physician will handle any further medical issues. Please note that NO REFILLS for any discharge medications will be authorized once you are discharged, as it is imperative that you return to your primary care physician (or establish a relationship with a primary care physician if you do not have one) for your post hospital discharge needs so that they can reassess your need for medications and monitor your lab values.   Discharge wound care:   Complete by: As directed    1. Apply xeroform gauze to left arm wound Q day, then cover with foam dressing.  (Change foam dressing to left arm Q 3 days or PRN soiling.) 2. Foam dressing to abd, change Q 3 days or PRN soiling.   Increase activity slowly   Complete by: As directed       Allergies as of 05/14/2022       Reactions   Penicillins Itching   Patient states that all family members are allergic to penicillin & that he has been told by another physician to avoid penicillin.         Medication List     STOP taking these medications    amLODipine 10 MG tablet Commonly known as: NORVASC    dutasteride 0.5 MG capsule Commonly known as: AVODART   glipiZIDE 5 MG tablet Commonly known as: GLUCOTROL   losartan 100 MG tablet Commonly known as: COZAAR   losartan 50 MG tablet Commonly known as: COZAAR       TAKE these medications    acetaminophen 325 MG tablet Commonly known as: TYLENOL Take 2 tablets (650 mg total) by mouth every 6 (six) hours as needed for mild pain (or Fever >/= 101).   atorvastatin 20 MG tablet Commonly known as: Lipitor Take 1 tablet (20 mg total) by mouth daily.   cholecalciferol 1000 units tablet Commonly known as: VITAMIN D Take 1 tablet (1,000 Units total) by mouth daily.   feeding supplement Liqd Take 237 mLs by mouth 3 (three) times daily between meals.   Gerhardt's butt cream Crea Apply 1 application. topically 2 (two) times daily. Add to left thigh, left lower abd near ostomy  insulin aspart 100 UNIT/ML injection Commonly known as: novoLOG 0-9 Units, Subcutaneous, 3 times daily with meals CBG < 70: Implement Hypoglycemia measures CBG 70 - 120: 0 units CBG 121 - 150: 1 unit CBG 151 - 200: 2 units CBG 201 - 250: 3 units CBG 251 - 300: 5 units CBG 301 - 350: 7 units CBG 351 - 400: 9 units CBG > 400: call MD   melatonin 5 MG Tabs Take 1 tablet (5 mg total) by mouth at bedtime.   metFORMIN 500 MG tablet Commonly known as: Glucophage Take 1 tablet (500 mg total) by mouth 2 (two) times daily with a meal. What changed:  how much to take when to take this   metoprolol tartrate 25 MG tablet Commonly known as: LOPRESSOR Take 1 tablet (25 mg total) by mouth 2 (two) times daily.   multivitamin with minerals Tabs tablet Take 1 tablet by mouth daily.   nystatin cream Commonly known as: MYCOSTATIN Apply topically 3 (three) times daily for 5 days. Apply to L intertriginous area with erythema   QUEtiapine 50 MG tablet Commonly known as: SEROQUEL Take 1 tablet (50 mg total) by mouth at bedtime.   SALONPAS GEL EX Apply 1  application. topically daily. Right shoulder   tamsulosin 0.4 MG Caps capsule Commonly known as: FLOMAX Take 1 capsule (0.4 mg total) by mouth daily.               Discharge Care Instructions  (From admission, onward)           Start     Ordered   05/14/22 0000  Discharge wound care:       Comments: 1. Apply xeroform gauze to left arm wound Q day, then cover with foam dressing.  (Change foam dressing to left arm Q 3 days or PRN soiling.) 2. Foam dressing to abd, change Q 3 days or PRN soiling.   05/14/22 0901            Follow-up Information     Primary care MD Follow up in 1 week(s).                 Allergies  Allergen Reactions   Penicillins Itching    Patient states that all family members are allergic to penicillin & that he has been told by another physician to avoid penicillin.      Other Procedures/Studies: CT ABDOMEN PELVIS WO CONTRAST  Result Date: 05/06/2022 CLINICAL DATA:  Sepsis, fall injury. EXAM: CT ABDOMEN AND PELVIS WITHOUT CONTRAST TECHNIQUE: Multidetector CT imaging of the abdomen and pelvis was performed following the standard protocol without IV contrast. RADIATION DOSE REDUCTION: This exam was performed according to the departmental dose-optimization program which includes automated exposure control, adjustment of the mA and/or kV according to patient size and/or use of iterative reconstruction technique. COMPARISON:  CT abdomen and pelvis with contrast 03/10/2022. FINDINGS: Factors affecting image quality: Respiratory motion limits image resolution in the abdomen, as well as streak artifact from the patient's arms in the field. Lower chest: No infiltrate is seen. The cardiac size is normal. There are coronary artery calcifications. Hepatobiliary: The liver is steatotic as before. No focal abnormality is seen through the breathing motion. There are stones in the gallbladder without obvious wall thickening, no biliary dilatation. Pancreas: No  abnormality is seen through the breathing motion. No inflammatory change or ductal dilatation. Spleen: No abnormality is seen through the breathing motion. No splenomegaly. Adrenals/Urinary Tract: Slight nodular thickening of the adrenal  glands is unchanged. No adrenal or perirenal hemorrhage. No obvious renal cortical abnormality is seen through the breathing motion. There is no urinary stone or obstruction. The bladder is contracted catheterized and not well seen. Stomach/Bowel: Previous total colectomy again noted. Left lower quadrant ileostomy. Mild fluid distention is increased in the stomach. No bowel obstruction or inflammatory changes are seen. Presacral postsurgical changes are again shown. 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 This was also seen on prostate biopsy CT images of 03/02/2017 and unchanged since then as well. There are phleboliths left hemipelvis. Vascular/Lymphatic: Mild aortic atherosclerosis.  No adenopathy. Reproductive: There has been a prior prostatectomy. Other: There small left paraumbilical and bilateral inguinal fat hernias. There is no incarcerated hernia. No left lower quadrant parastomal hernia. Cutaneous and subcutaneous scarring noted right lower abdomen anterior wall. There is no free air, hemorrhage or fluid. Musculoskeletal: There is osteopenia mild degenerative change of the spine. Mild features of symmetric nonerosive sacroiliitis. No acute renal regional skeletal fracture is seen. IMPRESSION: 1. Motion limited exam.  No obvious acute trauma related findings. 2. No etiology for sepsis is seen through the motion artifact. 3. Postsurgical and additional chronic findings. 4. Cholelithiasis without obvious gallbladder thickening or pericholecystic reaction. 5. Aortic and coronary artery atherosclerosis. Electronically Signed   By: Telford Nab M.D.   On: 05/06/2022 22:20   DG Abd 1  View  Result Date: 05/09/2022 CLINICAL DATA:  NG tube placement EXAM: ABDOMEN - 1 VIEW COMPARISON:  05/07/2022 FINDINGS: Enteric tube tip is present in the upper mid abdomen consistent with location in the distal stomach. Paucity of gas in the visualized upper abdomen. IMPRESSION: Enteric tube tip is in the upper abdomen consistent with location in the distal stomach. Electronically Signed   By: Lucienne Capers M.D.   On: 05/09/2022 21:50   DG Abd 1 View  Result Date: 05/07/2022 CLINICAL DATA:  NG placement. EXAM: ABDOMEN - 1 VIEW COMPARISON:  CT abdomen pelvis dated 05/06/2022. FINDINGS: Enteric tube with tip in the distal stomach.  No bowel dilatation. IMPRESSION: Enteric tube with tip in the distal stomach. Electronically Signed   By: Anner Crete M.D.   On: 05/07/2022 02:52   CT Head Wo Contrast  Result Date: 05/06/2022 CLINICAL DATA:  Mental status change, unknown cause EXAM: CT HEAD WITHOUT CONTRAST TECHNIQUE: Contiguous axial images were obtained from the base of the skull through the vertex without intravenous contrast. RADIATION DOSE REDUCTION: This exam was performed according to the departmental dose-optimization program which includes automated exposure control, adjustment of the mA and/or kV according to patient size and/or use of iterative reconstruction technique. COMPARISON:  Head CT 03/10/2022 FINDINGS: Brain: Submental exam performed due to patient motion and technical issues with CT scanner. This limits assessment. Allowing for this there is no evidence of hemorrhage, acute ischemia, midline shift or mass effect. Stable atrophy and periventricular white matter hypodensity. Vascular: No hyperdense vessel. Skull: No skull fracture. Stable sclerotic exostosis left frontal parietal bone. Sinuses/Orbits: Paranasal sinuses and mastoid air cells are clear. The visualized orbits are unremarkable. Other: None. IMPRESSION: 1. No acute intracranial abnormality. 2. Stable atrophy and chronic  small vessel ischemia. Electronically Signed   By: Keith Rake M.D.   On: 05/06/2022 22:01   CT Cervical Spine Wo Contrast  Result Date: 05/06/2022 CLINICAL DATA:  Neck trauma.  Fall. EXAM: CT CERVICAL SPINE WITHOUT CONTRAST TECHNIQUE: Multidetector CT  imaging of the cervical spine was performed without intravenous contrast. Multiplanar CT image reconstructions were also generated. RADIATION DOSE REDUCTION: This exam was performed according to the departmental dose-optimization program which includes automated exposure control, adjustment of the mA and/or kV according to patient size and/or use of iterative reconstruction technique. COMPARISON:  None Available. FINDINGS: Factors affecting image quality: There is beam hardening and loss of fine detail below the level of C4 due to superimposition of the patient's shoulders and there is mild motion artifact through the lower cervical levels. Alignment: Normal. Skull base and vertebrae: Osteopenia. The skull base is intact. The mastoid air cells, middle ear cavities are clear. No spinal compression fracture is seen. There is no displaced fracture visible through the artifacts. Soft tissues and spinal canal: No prevertebral fluid or swelling. No visible canal hematoma. Disc levels: There are normal disc heights at C2-3 and C3-4. There is partial disc space loss at C4-5, C5-6, C6-7 and C7-T1 with small bidirectional osteophytes. There are posterior disc osteophyte complexes mildly encroaching on the ventral cord surface at C5-6 and C6-7. Other levels do not show significant soft tissue or bony encroachment on the thecal sac. Incidentally noted is narrowing and osteophyte formation of the anterior atlantodental joint. Facet joint and uncinate spurring is seen at most levels, left-greater-than-right and greatest at C2-3 and C3-4. Acquired degenerative foraminal stenosis is severe on the left at C2-3, moderate on the left at C3-4, bilaterally mild at C4-5,  bilaterally moderate at C5-6 and C6-7. Upper chest: Negative. Other: None. IMPRESSION: Osteopenia and degenerative change without evidence of fractures or malalignment, but with limited image detail below C4. Electronically Signed   By: Telford Nab M.D.   On: 05/06/2022 22:30   DG Chest Port 1 View  Result Date: 05/07/2022 CLINICAL DATA:  66 year old male with history of acute respiratory failure with hypoxia. EXAM: PORTABLE CHEST 1 VIEW COMPARISON:  Chest x-ray 05/06/2022. FINDINGS: Nasogastric tube extends into the distal antral pre-pyloric region of the stomach or the proximal duodenum. Lung volumes are normal. Mild elevation of the right hemidiaphragm. No consolidative airspace disease. No pleural effusions. No pneumothorax. No pulmonary nodule or mass noted. Pulmonary vasculature and the cardiomediastinal silhouette are within normal limits. IMPRESSION: 1. Support apparatus, as above. 2. No radiographic evidence of acute cardiopulmonary disease. Electronically Signed   By: Vinnie Langton M.D.   On: 05/07/2022 07:16   DG Chest Port 1 View  Result Date: 05/06/2022 CLINICAL DATA:  Shortness of breath EXAM: PORTABLE CHEST 1 VIEW COMPARISON:  March 10, 2022 FINDINGS: Lung volumes are low. No new consolidation or edema. No pleural effusion or pneumothorax. Similar cardiomediastinal contours allowing for differences in technique. IMPRESSION: No acute process in the chest. Electronically Signed   By: Macy Mis M.D.   On: 05/06/2022 19:28   DG Abd Portable 1V  Result Date: 05/10/2022 CLINICAL DATA:  Feeding tube placement. EXAM: PORTABLE ABDOMEN - 1 VIEW COMPARISON:  05/09/2022 FINDINGS: A small bore feeding tube is noted with tip overlying the peripyloric region. Bowel gas pattern is unremarkable. IMPRESSION: Small bore feeding tube with tip overlying the peripyloric region. Electronically Signed   By: Margarette Canada M.D.   On: 05/10/2022 14:49   ECHOCARDIOGRAM COMPLETE  Result Date: 05/10/2022     ECHOCARDIOGRAM REPORT   Patient Name:   JAKARI SADA Date of Exam: 05/10/2022 Medical Rec #:  [id number]       Height:       66.0 in Accession #:  [id number]      Weight:       239.2 lb Date of Birth:  September 16, 1956        BSA:          2.158 m Patient Age:    45 years        BP:           140/78 mmHg Patient Gender: M               HR:           96 bpm. Exam Location:  Inpatient Procedure: 2D Echo, Cardiac Doppler, Color Doppler and Intracardiac            Opacification Agent Indications:    AF  History:        Patient has no prior history of Echocardiogram examinations.                 Risk Factors:Hypertension.  Sonographer:    Luisa Hart RDCS Referring Phys: [place]  Sonographer Comments: Technically difficult study due to poor echo windows. IMPRESSIONS  1. Left ventricular ejection fraction, by estimation, is 60 to 65%. The left ventricle has normal function. The left ventricle has no regional wall motion abnormalities. Left ventricular diastolic parameters are consistent with Grade I diastolic dysfunction (impaired relaxation).  2. Right ventricular systolic function is normal. The right ventricular size is normal. There is normal pulmonary artery systolic pressure. The estimated right ventricular systolic pressure is 02.6 mmHg.  3. The mitral valve is normal in structure. No evidence of mitral valve regurgitation. No evidence of mitral stenosis.  4. The aortic valve is tricuspid. Aortic valve regurgitation is not visualized. No aortic stenosis is present.  5. Aortic dilatation noted. There is mild dilatation of the aortic root, measuring 38 mm.  6. The inferior vena cava is normal in size with greater than 50% respiratory variability, suggesting right atrial pressure of 3 mmHg. FINDINGS  Left Ventricle: Left ventricular ejection fraction, by estimation, is 60 to 65%. The left ventricle has normal function. The left ventricle has no regional wall motion abnormalities. The left ventricular  internal cavity size was normal in size. There is  no left ventricular hypertrophy. Left ventricular diastolic parameters are consistent with Grade I diastolic dysfunction (impaired relaxation). Right Ventricle: The right ventricular size is normal. No increase in right ventricular wall thickness. Right ventricular systolic function is normal. There is normal pulmonary artery systolic pressure. The tricuspid regurgitant velocity is 2.23 m/s, and  with an assumed right atrial pressure of 3 mmHg, the estimated right ventricular systolic pressure is 37.8 mmHg. Left Atrium: Left atrial size was normal in size. Right Atrium: Right atrial size was normal in size. Pericardium: There is no evidence of pericardial effusion. Mitral Valve: The mitral valve is normal in structure. Mild mitral annular calcification. No evidence of mitral valve regurgitation. No evidence of mitral valve stenosis. Tricuspid Valve: The tricuspid valve is normal in structure. Tricuspid valve regurgitation is trivial. Aortic Valve: The aortic valve is tricuspid. Aortic valve regurgitation is not visualized. No aortic stenosis is present. Aortic valve mean gradient measures 8.0 mmHg. Aortic valve peak gradient measures 13.2 mmHg. Pulmonic Valve: The pulmonic valve was normal in structure. Pulmonic valve regurgitation is not visualized. Aorta: Aortic dilatation noted. There is mild dilatation of the aortic root, measuring 38 mm. Venous: The inferior vena cava is normal in size with greater than 50% respiratory variability, suggesting right atrial pressure of 3 mmHg. IAS/Shunts:  No atrial level shunt detected by color flow Doppler.  LEFT VENTRICLE PLAX 2D LVIDd:         3.00 cm     Diastology LVIDs:         2.80 cm     LV e' medial:    6.38 cm/s LV PW:         2.00 cm     LV E/e' medial:  12.4 LV IVS:        1.40 cm     LV e' lateral:   9.08 cm/s LVOT diam:     2.20 cm     LV E/e' lateral: 8.7 LVOT Area:     3.80 cm  LV Volumes (MOD) LV vol d, MOD A4C:  47.0 ml LV vol s, MOD A4C: 10.5 ml LV SV MOD A4C:     47.0 ml RIGHT VENTRICLE RV Basal diam:  2.60 cm RV Mid diam:    2.00 cm RV S prime:     18.30 cm/s TAPSE (M-mode): 1.9 cm LEFT ATRIUM           Index        RIGHT ATRIUM           Index LA diam:      3.20 cm 1.48 cm/m   RA Area:     17.10 cm LA Vol (A2C): 14.3 ml 6.63 ml/m   RA Volume:   36.70 ml  17.01 ml/m LA Vol (A4C): 35.2 ml 16.31 ml/m  AORTIC VALVE               PULMONIC VALVE AV Vmax:      182.00 cm/s  PV Vmax:       1.04 m/s AV Vmean:     125.550 cm/s PV Vmean:      75.500 cm/s AV VTI:       0.292 m      PV VTI:        0.178 m AV Peak Grad: 13.2 mmHg    PV Peak grad:  4.3 mmHg AV Mean Grad: 8.0 mmHg     PV Mean grad:  3.0 mmHg  AORTA Ao Root diam: 3.80 cm Ao Asc diam:  3.55 cm MITRAL VALVE                TRICUSPID VALVE MV Area (PHT): 3.53 cm     TR Peak grad:   19.9 mmHg MV Decel Time: 215 msec     TR Vmax:        223.00 cm/s MV E velocity: 79.00 cm/s MV A velocity: 111.00 cm/s  SHUNTS MV E/A ratio:  0.71         Systemic Diam: 2.20 cm Dalton McleanMD Electronically signed by Franki [place] Signature Date/Time: 05/10/2022/3:20:48 PM    Final      TODAY-DAY OF DISCHARGE:  Subjective:   Erik Turner today has no headache,no chest abdominal pain,no new weakness tingling or numbness, feels much better wants to go home today.  Objective:   Blood pressure (!) 123/56, pulse (!) 59, temperature 97.9 F (36.6 C), temperature source Oral, resp. rate 17, height '5\' 6"'$  (1.676 m), weight 102.2 kg, SpO2 97 %.  Intake/Output Summary (Last 24 hours) at 05/14/2022 0901 Last data filed at 05/14/2022 0542 Gross per 24 hour  Intake 1626.36 ml  Output 1150 ml  Net 476.36 ml   Filed Weights   05/11/22 0425 05/13/22 0500 05/14/22 0406  Weight: 106.3 kg 101.8 kg 102.2 kg  Exam: Awake Alert, Oriented *3, No new F.N deficits, Normal affect [place].AT,PERRAL Supple Neck,No JVD, No cervical lymphadenopathy appriciated.  Symmetrical Chest wall  movement, Good air movement bilaterally, CTAB RRR,No Gallops,Rubs or new Murmurs, No Parasternal Heave +ve B.Sounds, Abd Soft, Non tender, No organomegaly appriciated, No rebound -guarding or rigidity. No Cyanosis, Clubbing or edema, No new Rash or bruise   PERTINENT RADIOLOGIC STUDIES: No results found.   PERTINENT LAB RESULTS: CBC: Recent Labs    05/12/22 0502 05/14/22 0100  WBC 5.9 8.4  HGB 10.5* 10.2*  HCT 32.1* 31.9*  PLT 112* 120*   CMET CMP     Component Value Date/Time   NA 144 05/14/2022 0100   K 3.6 05/14/2022 0100   CL 110 05/14/2022 0100   CO2 27 05/14/2022 0100   GLUCOSE 114 (H) 05/14/2022 0100   BUN 43 (H) 05/14/2022 0100   CREATININE 1.42 (H) 05/14/2022 0100   CALCIUM 8.6 (L) 05/14/2022 0100   PROT 5.4 (L) 05/11/2022 0442   ALBUMIN 2.5 (L) 05/11/2022 0442   AST 36 05/11/2022 0442   ALT 20 05/11/2022 0442   ALKPHOS 41 05/11/2022 0442   BILITOT 0.5 05/11/2022 0442   GFRNONAA 54 (L) 05/14/2022 0100   GFRAA >60 03/02/2017 0924    GFR Estimated Creatinine Clearance: 56.5 mL/min (A) (by C-G formula based on SCr of 1.42 mg/dL (H)). No results for input(s): LIPASE, AMYLASE in the last 72 hours. No results for input(s): CKTOTAL, CKMB, CKMBINDEX, TROPONINI in the last 72 hours. Invalid input(s): POCBNP No results for input(s): DDIMER in the last 72 hours. No results for input(s): HGBA1C in the last 72 hours. No results for input(s): CHOL, HDL, LDLCALC, TRIG, CHOLHDL, LDLDIRECT in the last 72 hours. No results for input(s): TSH, T4TOTAL, T3FREE, THYROIDAB in the last 72 hours.  Invalid input(s): FREET3 No results for input(s): VITAMINB12, FOLATE, FERRITIN, TIBC, IRON, RETICCTPCT in the last 72 hours. Coags: No results for input(s): INR in the last 72 hours.  Invalid input(s): PT Microbiology: Recent Results (from the past 240 hour(s))  Resp Panel by RT-PCR (Flu A&B, Covid) Nasopharyngeal Swab     Status: Abnormal   Collection Time: 05/06/22  7:04 PM    Specimen: Nasopharyngeal Swab; Nasopharyngeal(NP) swabs in vial transport medium  Result Value Ref Range Status   SARS Coronavirus 2 by RT PCR POSITIVE (A) NEGATIVE Final    Comment: (NOTE) SARS-CoV-2 target nucleic acids are DETECTED.  The SARS-CoV-2 RNA is generally detectable in upper respiratory specimens during the acute phase of infection. Positive results are indicative of the presence of the identified virus, but do not rule out bacterial infection or co-infection with other pathogens not detected by the test. Clinical correlation with patient history and other diagnostic information is necessary to determine patient infection status. The expected result is Negative.  Fact Sheet for Patients: EntrepreneurPulse.com.au  Fact Sheet for Healthcare Providers: IncredibleEmployment.be  This test is not yet approved or cleared by the [place] FDA and  has been authorized for detection and/or diagnosis of SARS-CoV-2 by FDA under an Emergency Use Authorization (EUA).  This EUA will remain in effect (meaning this test can be used) for the duration of  the COVID-19 declaration under Section 564(b)(1) of the A ct, 21 U.S.C. section 360bbb-3(b)(1), unless the authorization is terminated or revoked sooner.     Influenza A by PCR NEGATIVE NEGATIVE Final   Influenza B by PCR NEGATIVE NEGATIVE Final    Comment: (NOTE) The Xpert Xpress SARS-CoV-2/FLU/RSV plus assay  is intended as an aid in the diagnosis of influenza from Nasopharyngeal swab specimens and should not be used as a sole basis for treatment. Nasal washings and aspirates are unacceptable for Xpert Xpress SARS-CoV-2/FLU/RSV testing.  Fact Sheet for Patients: EntrepreneurPulse.com.au  Fact Sheet for Healthcare Providers: IncredibleEmployment.be  This test is not yet approved or cleared by the [place] FDA and has been authorized for detection  and/or diagnosis of SARS-CoV-2 by FDA under an Emergency Use Authorization (EUA). This EUA will remain in effect (meaning this test can be used) for the duration of the COVID-19 declaration under Section 564(b)(1) of the Act, 21 U.S.C. section 360bbb-3(b)(1), unless the authorization is terminated or revoked.  Performed at [facility], [place] [address] [place], [place] [address]   Culture, blood (routine x 2)     Status: None   Collection Time: 05/06/22  7:13 PM   Specimen: BLOOD RIGHT WRIST  Result Value Ref Range Status   Specimen Description BLOOD RIGHT WRIST  Final   Special Requests   Final    BOTTLES DRAWN AEROBIC AND ANAEROBIC Blood Culture results may not be optimal due to an inadequate volume of blood received in culture bottles   Culture   Final    NO GROWTH 5 DAYS Performed at [facility], [place] [address] [place], [place] [address]    Report Status 05/11/2022 FINAL  Final  Culture, blood (routine x 2)     Status: None   Collection Time: 05/06/22  7:15 PM   Specimen: Left Antecubital; Blood  Result Value Ref Range Status   Specimen Description LEFT ANTECUBITAL  Final   Special Requests   Final    BOTTLES DRAWN AEROBIC AND ANAEROBIC Blood Culture adequate volume   Culture   Final    NO GROWTH 5 DAYS Performed at [facility], [place] [address], [place], [place] [address]    Report Status 05/11/2022 FINAL  Final  Urine Culture     Status: None   Collection Time: 05/06/22  8:58 PM   Specimen: Urine, Clean Catch  Result Value Ref Range Status   Specimen Description URINE, CLEAN CATCH  Final   Special Requests NONE  Final   Culture   Final    NO GROWTH Performed at [facility], [place] [address] [place], [place] [address]    Report Status 05/08/2022 FINAL  Final  MRSA Next Gen by PCR, Nasal     Status: None   Collection Time: 05/06/22 11:16 PM   Specimen: Nasal Mucosa; Nasal Swab  Result Value Ref Range Status   MRSA by PCR Next Gen NOT  DETECTED NOT DETECTED Final    Comment: (NOTE) The GeneXpert MRSA Assay (FDA approved for NASAL specimens only), is one component of a comprehensive MRSA colonization surveillance program. It is not intended to diagnose MRSA infection nor to guide or monitor treatment for MRSA infections. Test performance is not FDA approved in patients less than 54 years old. Performed at [facility], [place] [address], [place], [place] [address]     FURTHER DISCHARGE INSTRUCTIONS:  Get Medicines reviewed and adjusted: Please take all your medications with you for your next visit with your Primary MD  Laboratory/radiological data: Please request your Primary MD to go over all hospital tests and procedure/radiological results at the follow up, please ask your Primary MD to get all Hospital records sent to his/her office.  In some cases, they will be blood work,  cultures and biopsy results pending at the time of your discharge. Please request that your primary care M.D. goes through all the records of your hospital data and follows up on these results.  Also Note the following: If you experience worsening of your admission symptoms, develop shortness of breath, life threatening emergency, suicidal or homicidal thoughts you must seek medical attention immediately by calling 911 or calling your MD immediately  if symptoms less severe.  You must read complete instructions/literature along with all the possible adverse reactions/side effects for all the Medicines you take and that have been prescribed to you. Take any new Medicines after you have completely understood and accpet all the possible adverse reactions/side effects.   Do not drive when taking Pain medications or sleeping medications (Benzodaizepines)  Do not take more than prescribed Pain, Sleep and Anxiety Medications. It is not advisable to combine anxiety,sleep and pain medications without talking with your primary care  practitioner  Special Instructions: If you have smoked or chewed Tobacco  in the last 2 yrs please stop smoking, stop any regular Alcohol  and or any Recreational drug use.  Wear Seat belts while driving.  Please note: You were cared for by a hospitalist during your hospital stay. Once you are discharged, your primary care physician will handle any further medical issues. Please note that NO REFILLS for any discharge medications will be authorized once you are discharged, as it is imperative that you return to your primary care physician (or establish a relationship with a primary care physician if you do not have one) for your post hospital discharge needs so that they can reassess your need for medications and monitor your lab values.  Total Time spent coordinating discharge including counseling, education and face to face time equals greater than 30 minutes.  SignedOren Binet 05/14/2022 9:01 AM

## 2023-05-11 ENCOUNTER — Emergency Department (HOSPITAL_COMMUNITY): Payer: Medicare (Managed Care)

## 2023-05-11 ENCOUNTER — Emergency Department (HOSPITAL_COMMUNITY)
Admission: EM | Admit: 2023-05-11 | Discharge: 2023-05-11 | Disposition: A | Discharge status: Skilled Nursing Facility | Payer: Medicare (Managed Care) | Attending: Emergency Medicine | Admitting: Emergency Medicine

## 2023-05-11 DIAGNOSIS — Z79899 Other long term (current) drug therapy: Secondary | ICD-10-CM | POA: Insufficient documentation

## 2023-05-11 DIAGNOSIS — S00412A Abrasion of left ear, initial encounter: Secondary | ICD-10-CM | POA: Diagnosis present

## 2023-05-11 DIAGNOSIS — Z794 Long term (current) use of insulin: Secondary | ICD-10-CM | POA: Insufficient documentation

## 2023-05-11 DIAGNOSIS — W19XXXA Unspecified fall, initial encounter: Secondary | ICD-10-CM | POA: Insufficient documentation

## 2023-05-11 DIAGNOSIS — D696 Thrombocytopenia, unspecified: Secondary | ICD-10-CM | POA: Diagnosis not present

## 2023-05-11 DIAGNOSIS — D649 Anemia, unspecified: Secondary | ICD-10-CM | POA: Diagnosis not present

## 2023-05-11 DIAGNOSIS — E119 Type 2 diabetes mellitus without complications: Secondary | ICD-10-CM | POA: Diagnosis not present

## 2023-05-11 DIAGNOSIS — S70312A Abrasion, left thigh, initial encounter: Secondary | ICD-10-CM | POA: Insufficient documentation

## 2023-05-11 DIAGNOSIS — I6782 Cerebral ischemia: Secondary | ICD-10-CM | POA: Diagnosis not present

## 2023-05-11 DIAGNOSIS — I10 Essential (primary) hypertension: Secondary | ICD-10-CM | POA: Insufficient documentation

## 2023-05-11 DIAGNOSIS — Z7984 Long term (current) use of oral hypoglycemic drugs: Secondary | ICD-10-CM | POA: Insufficient documentation

## 2023-05-11 DIAGNOSIS — D72829 Elevated white blood cell count, unspecified: Secondary | ICD-10-CM | POA: Diagnosis not present

## 2023-05-11 DIAGNOSIS — S60812A Abrasion of left wrist, initial encounter: Secondary | ICD-10-CM | POA: Diagnosis not present

## 2023-05-11 DIAGNOSIS — N179 Acute kidney failure, unspecified: Secondary | ICD-10-CM | POA: Insufficient documentation

## 2023-05-11 DIAGNOSIS — Z23 Encounter for immunization: Secondary | ICD-10-CM | POA: Diagnosis not present

## 2023-05-11 LAB — CBC WITH DIFFERENTIAL/PLATELET
Abs Immature Granulocytes: 0.05 10*3/uL (ref 0.00–0.07)
Basophils Absolute: 0 10*3/uL (ref 0.0–0.1)
Basophils Relative: 0 %
Eosinophils Absolute: 0.1 10*3/uL (ref 0.0–0.5)
Eosinophils Relative: 1 %
HCT: 39.9 % (ref 39.0–52.0)
Hemoglobin: 12.8 g/dL — ABNORMAL LOW (ref 13.0–17.0)
Immature Granulocytes: 0 %
Lymphocytes Relative: 11 %
Lymphs Abs: 1.4 10*3/uL (ref 0.7–4.0)
MCH: 29.2 pg (ref 26.0–34.0)
MCHC: 32.1 g/dL (ref 30.0–36.0)
MCV: 90.9 fL (ref 80.0–100.0)
Monocytes Absolute: 0.9 10*3/uL (ref 0.1–1.0)
Monocytes Relative: 7 %
Neutro Abs: 10 10*3/uL — ABNORMAL HIGH (ref 1.7–7.7)
Neutrophils Relative %: 81 %
Platelets: 118 10*3/uL — ABNORMAL LOW (ref 150–400)
RBC: 4.39 MIL/uL (ref 4.22–5.81)
RDW: 13.8 % (ref 11.5–15.5)
WBC: 12.4 10*3/uL — ABNORMAL HIGH (ref 4.0–10.5)
nRBC: 0 % (ref 0.0–0.2)

## 2023-05-11 LAB — URINALYSIS, ROUTINE W REFLEX MICROSCOPIC
Bacteria, UA: NONE SEEN
Bilirubin Urine: NEGATIVE
Glucose, UA: >=500 mg/dL — AB
Ketones, ur: NEGATIVE mg/dL
Leukocytes,Ua: NEGATIVE
Nitrite: NEGATIVE
Protein, ur: 30 mg/dL — AB
Specific Gravity, Urine: 1.029 (ref 1.005–1.030)
pH: 5 (ref 5.0–8.0)

## 2023-05-11 LAB — BASIC METABOLIC PANEL
Anion gap: 8 (ref 5–15)
BUN: 26 mg/dL — ABNORMAL HIGH (ref 8–23)
CO2: 21 mmol/L — ABNORMAL LOW (ref 22–32)
Calcium: 8.7 mg/dL — ABNORMAL LOW (ref 8.9–10.3)
Chloride: 109 mmol/L (ref 98–111)
Creatinine, Ser: 1.61 mg/dL — ABNORMAL HIGH (ref 0.61–1.24)
GFR, Estimated: 46 mL/min — ABNORMAL LOW (ref >60)
Glucose, Bld: 133 mg/dL — ABNORMAL HIGH (ref 70–99)
Potassium: 3.5 mmol/L (ref 3.5–5.1)
Sodium: 138 mmol/L (ref 135–145)

## 2023-05-11 LAB — MAGNESIUM: Magnesium: 1.7 mg/dL (ref 1.7–2.4)

## 2023-05-11 MED ORDER — TETANUS-DIPHTH-ACELL PERTUSSIS 5-2.5-18.5 LF-MCG/0.5 IM SUSY
0.5000 mL | PREFILLED_SYRINGE | Freq: Once | INTRAMUSCULAR | Status: AC
  Administered 2023-05-11: 0.5 mL via INTRAMUSCULAR
  Filled 2023-05-11: qty 0.5

## 2023-05-11 MED ORDER — ACETAMINOPHEN 500 MG PO TABS
1000.0000 mg | ORAL_TABLET | Freq: Once | ORAL | Status: AC
  Administered 2023-05-11: 1000 mg via ORAL
  Filled 2023-05-11: qty 2

## 2023-05-11 MED ORDER — LACTATED RINGERS IV BOLUS
500.0000 mL | Freq: Once | INTRAVENOUS | Status: AC
  Administered 2023-05-11: 500 mL via INTRAVENOUS

## 2023-05-11 NOTE — ED Notes (Signed)
[facility] notified of transport request, Spoke with staff at [facility] r/t d/c
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
## 2023-05-11 NOTE — ED Triage Notes (Signed)
BIB EMS from [facility] s/p mechanical fall onto left side. Superficial abrasions to ear and neck. Denies pain, Denies head injury or LOC. Denies blood thinners. A&Ox 4 154/98 74 16 98% RA CBG 156

## 2023-05-11 NOTE — ED Provider Notes (Signed)
Erik Turner Provider Note   CSN: [id number] Arrival date & time: 05/11/23  4098     History  Chief Complaint  Patient presents with   Erik Turner is a 67 y.o. male. With pmh HTN, HLD, DM-2, s/p colectomy presenting with abrasions to left ear and left thigh and wrist after mechanical fall ambulating outside without his walker.  Patient reports he was trying to go to his car to get something do not use his walker which he typically uses.  He lost his balance and fell to his left side.  He did not lose consciousness and he is not on any anticoagulation.  He was unable to stand up or get up and has felt generally weak more often than lately so his facility called the ambulance to pick him up and take him to the hospital to be evaluated.  Patient denies any chest pain, shortness of breath, vomiting, diarrhea or fevers or infectious complaints.  He says he does not hurt whenever I do not touch him.  He does not know when his last tetanus was.  Does not endorse any focal weakness numbness or tingling, slurred speech, facial droop.   Fall       Home Medications Prior to Admission medications   Medication Sig Start Date End Date Taking? Authorizing Provider  acetaminophen (TYLENOL) 325 MG tablet Take 2 tablets (650 mg total) by mouth every 6 (six) hours as needed for mild pain (or Fever >/= 101). 03/13/22   Gonfa, Taye T, MD  atorvastatin (LIPITOR) 20 MG tablet Take 1 tablet (20 mg total) by mouth daily. Patient not taking: Reported on 05/06/2022 03/13/22 09/09/22  Almon Hercules, MD  Capsaicin-Menthol (SALONPAS GEL EX) Apply 1 application. topically daily. Right shoulder    [provider]  cholecalciferol (VITAMIN D) 1000 units tablet Take 1 tablet (1,000 Units total) by mouth daily. 03/13/22   Almon Hercules, MD  feeding supplement (ENSURE ENLIVE / ENSURE PLUS) LIQD Take 237 mLs by mouth 3 (three) times daily between meals. 05/14/22    Ghimire, Werner Lean, MD  insulin aspart (NOVOLOG) 100 UNIT/ML injection 0-9 Units, Subcutaneous, 3 times daily with meals CBG < 70: Implement Hypoglycemia measures CBG 70 - 120: 0 units CBG 121 - 150: 1 unit CBG 151 - 200: 2 units CBG 201 - 250: 3 units CBG 251 - 300: 5 units CBG 301 - 350: 7 units CBG 351 - 400: 9 units CBG > 400: call MD 05/14/22   Maretta Bees, MD  melatonin 5 MG TABS Take 1 tablet (5 mg total) by mouth at bedtime. 05/14/22   Ghimire, Werner Lean, MD  metFORMIN (GLUCOPHAGE) 500 MG tablet Take 1 tablet (500 mg total) by mouth 2 (two) times daily with a meal. Patient taking differently: Take 1,000 mg by mouth daily with breakfast. 03/13/22 09/09/22  Almon Hercules, MD  metoprolol tartrate (LOPRESSOR) 25 MG tablet Take 1 tablet (25 mg total) by mouth 2 (two) times daily. 05/14/22   Ghimire, Werner Lean, MD  Multiple Vitamin (MULTIVITAMIN WITH MINERALS) TABS tablet Take 1 tablet by mouth daily. Patient not taking: Reported on 05/06/2022 03/13/22   Almon Hercules, MD  Nystatin (GERHARDT'S BUTT CREAM) CREA Apply 1 application. topically 2 (two) times daily. Add to left thigh, left lower abd near ostomy 05/14/22   Maretta Bees, MD  QUEtiapine (SEROQUEL) 50 MG tablet Take 1 tablet (50 mg total) by  mouth at bedtime. 05/14/22   Ghimire, Werner Lean, MD  tamsulosin (FLOMAX) 0.4 MG CAPS capsule Take 1 capsule (0.4 mg total) by mouth daily. 05/14/22   Ghimire, Werner Lean, MD      Allergies    Penicillins    Review of Systems   Review of Systems  Physical Exam Updated Vital Signs BP (!) 150/80 (BP Location: Right Arm)   Pulse (!) 52   Temp 97.7 F (36.5 C) (Oral)   Resp 17   SpO2 100%  Physical Exam Constitutional: Alert and orientedx4.  Chronically ill-appearing but no acute distress and nontoxic Eyes: Conjunctivae are normal. ENT      Head: Normocephalic and small ecchymoses and abrasions overlying the left external ear helix      Nose: No bleeding.      Neck/spine: No midline  tenderness to palpation step-off or deformity. Cardiovascular: S1, S2,  Normal and symmetric distal pulses are present in all extremities.Warm and well perfused. Respiratory: Normal respiratory effort. Breath sounds are normal.  O2 sat 98 on RA no chest wall tenderness or crepitus or deformity Gastrointestinal: Soft and nontender.  Previous surgical scars clean dry and intact. Musculoskeletal: Normal range of motion in all extremities. Neurologic: Normal speech and language.  No facial droop.  AOx4, GCS 15.  5 out of 5 strength bilateral upper and lower extremities.  Sensation grossly intact.  No gross focal neurologic deficits are appreciated. Skin: Skin is warm, dry.  Abrasions overlying left dorsal wrist, left thigh and left knee. Psychiatric: Mood and affect are normal. Speech and behavior are normal.  ED Results / Procedures / Treatments   Labs (all labs ordered are listed, but only abnormal results are displayed) Labs Reviewed  BASIC METABOLIC PANEL - Abnormal; Notable for the following components:      Result Value   CO2 21 (*)    Glucose, Bld 133 (*)    BUN 26 (*)    Creatinine, Ser 1.61 (*)    Calcium 8.7 (*)    GFR, Estimated 46 (*)    All other components within normal limits  CBC WITH DIFFERENTIAL/PLATELET - Abnormal; Notable for the following components:   WBC 12.4 (*)    Hemoglobin 12.8 (*)    Platelets 118 (*)    Neutro Abs 10.0 (*)    All other components within normal limits  URINALYSIS, ROUTINE W REFLEX MICROSCOPIC - Abnormal; Notable for the following components:   Glucose, UA >=500 (*)    Hgb urine dipstick MODERATE (*)    Protein, ur 30 (*)    All other components within normal limits  MAGNESIUM    EKG None  Radiology CT Head Wo Contrast  Result Date: 05/11/2023 CLINICAL DATA:  Fall EXAM: CT HEAD WITHOUT CONTRAST TECHNIQUE: Contiguous axial images were obtained from the base of the skull through the vertex without intravenous contrast. RADIATION DOSE  REDUCTION: This exam was performed according to the departmental dose-optimization program which includes automated exposure control, adjustment of the mA and/or kV according to patient size and/or use of iterative reconstruction technique. COMPARISON:  05/06/2022 and 03/10/2022. FINDINGS: Brain: No evidence of acute infarction, hemorrhage, hydrocephalus, extra-axial collection or mass lesion/mass effect. Mild cortical atrophy. Subcortical white matter and periventricular small vessel ischemic changes. 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 Vascular: No hyperdense vessel or unexpected calcification. Skull: Normal. Negative for fracture or focal lesion. Sinuses/Orbits: The visualized paranasal  sinuses are essentially clear. The mastoid air cells are unopacified. Other: None. IMPRESSION: No acute intracranial abnormality. Atrophy with small vessel ischemic changes. 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 Electronically Signed   By: Charline Bills M.D.   On: 05/11/2023 11:48   DG Chest Portable 1 View  Result Date: 05/11/2023 CLINICAL DATA:  Leukocytosis. EXAM: PORTABLE CHEST 1 VIEW COMPARISON:  May 07, 2022. FINDINGS: The heart size and mediastinal contours are within normal limits. Both lungs are clear. The visualized skeletal structures are unremarkable. IMPRESSION: No active disease. Electronically Signed   By: Lupita Raider M.D.   On: 05/11/2023 10:52   DG Wrist Complete Left  Result Date: 05/11/2023 CLINICAL DATA:  Fall EXAM: LEFT WRIST - COMPLETE 3+ VIEW COMPARISON:  None available FINDINGS: No fracture or dislocation. Soft tissues are unremarkable. IMPRESSION: No acute abnormality of the left wrist. Electronically Signed   By: Acquanetta Belling M.D.   On: 05/11/2023 09:51    Procedures Procedures    Medications  Ordered in ED Medications  Tdap (BOOSTRIX) injection 0.5 mL (0.5 mLs Intramuscular Given 05/11/23 0950)  acetaminophen (TYLENOL) tablet 1,000 mg (1,000 mg Oral Given 05/11/23 0925)  lactated ringers bolus 500 mL (0 mLs Intravenous Stopped 05/11/23 1150)    ED Course/ Medical Decision Making/ A&P Clinical Course as of 05/11/23 1258  Fri May 11, 2023  1025 Labs notable for mild AKI creatinine 1.61 with elevated BUN 26 increased from baseline of approximately 1.3 from 1 year prior.  I have ordered for a small bolus of IV fluids .  Potassium is 3.5 within normal limits.  Mild leukocytosis 12.4.  Chronic anemia hemoglobin 12.8 improved from baseline as well as chronic thrombocytopenia 118.  Magnesium 1.7 within normal limits [VB]    Clinical Course User Index [VB] Mardene Sayer, MD                             Medical Decision Making  RUFFIN BERZINS is a 67 y.o. male. With pmh HTN, HLD, DM-2, s/p colectomy presenting with abrasions to left ear and left thigh and wrist after mechanical fall ambulating outside without his walker.    Patient presents GCS 15 and neurologically intact.  I have no concern for focal deficits or CVA.  He did have a mechanical fall due to generalized fatigue and weakness pursued basic workup to further evaluate for acute electrolyte abnormalities, anemia, possible infection although no reported infectious symptoms.  Labs reviewed by me.  He had a mild leukocytosis 12.4 with left shift.  UA with moderate hemoglobin and protein urea possible dehydration however no WBCs no bacteria no leukocyte esterase, no findings consistent with UTI.  Chest x-ray reviewed by me no evidence of pneumonia or pneumothorax.  mild AKI creatinine 1.61 with elevated BUN 26 increased from baseline of approximately 1.3 from 1 year prior.  I have ordered for a small bolus of IV fluids .  Potassium is 3.5 within normal limits.    Chronic anemia hemoglobin 12.8 improved from baseline as well as  chronic thrombocytopenia 118.  Magnesium 1.7 within normal limits [VB]  CT head with no ICH or acute findings.  Wrist x-ray with no fracture or dislocation.  Patient had scattered abrasions of left ear, left wrist and left thigh however no bony tenderness requiring any further imaging.  Patient given small IV fluid bolus, Tdap and Tylenol here.  No acute findings requiring admission at this time.  Discussed close follow-up with PCP and return precautions.  He is safe to be discharged back to facility.  Amount and/or Complexity of Data Reviewed Labs: ordered. Radiology: ordered.  Risk OTC drugs. Prescription drug management.      Final Clinical Impression(s) / ED Diagnoses Final diagnoses:  Fall, initial encounter  AKI (acute kidney injury) [facility])    Rx / DC Orders ED Discharge Orders     None         Mardene Sayer, MD 05/11/23 1258

## 2023-09-05 IMAGING — CT CT HEAD W/O CM
5 of 9 series · 13 of 47 positions shown, 14 images · non-contrast
Comparison: Head CT 03/10/2022

CLINICAL DATA: Mental status change, unknown cause



[Series 4: head without · axial · non-contrast · 0.48mm/px · z∈[-54,-24]mm · 2 of 19 slices shown, 3 images (1 of 2)]
[im 7/19  brain]
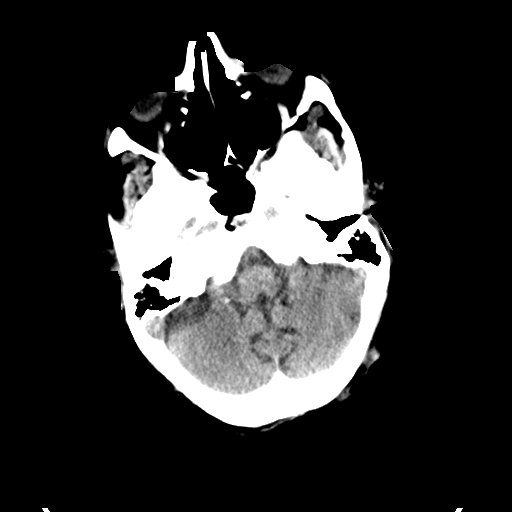
[im 7/19  bone]
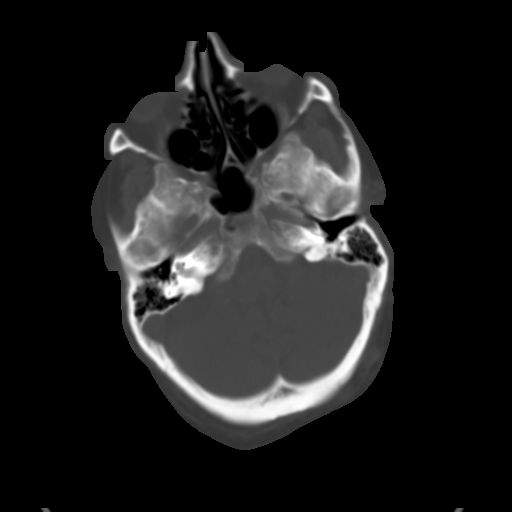
[im 13/19  brain]
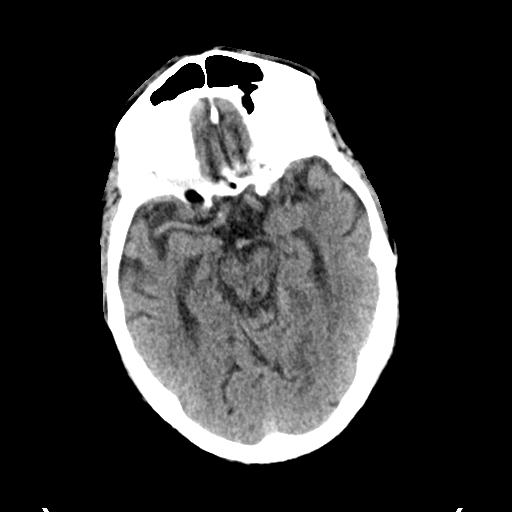

[Series 5: head without · axial · non-contrast · 0.48mm/px · z∈[+26,+51]mm · 2 of 17 slices shown (2 of 2)]
[im 6/17  brain]
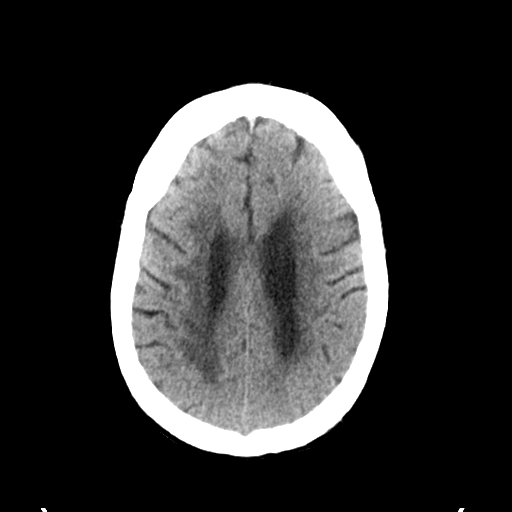
[im 11/17  brain]
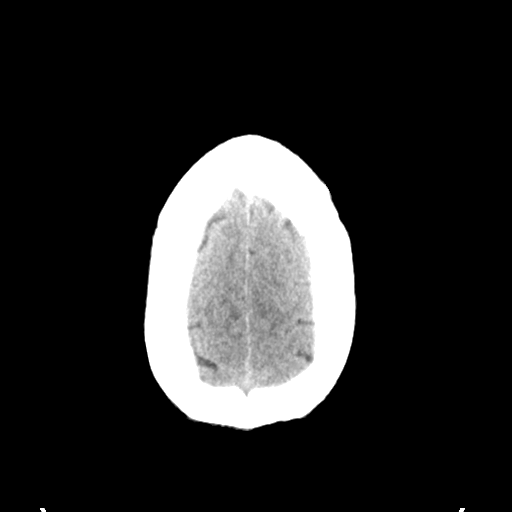

[Series 6: head bone · axial · 0.48mm/px · z∈[-74,-44]mm · 4 of 47 slices shown]
[im 6/47  bone]
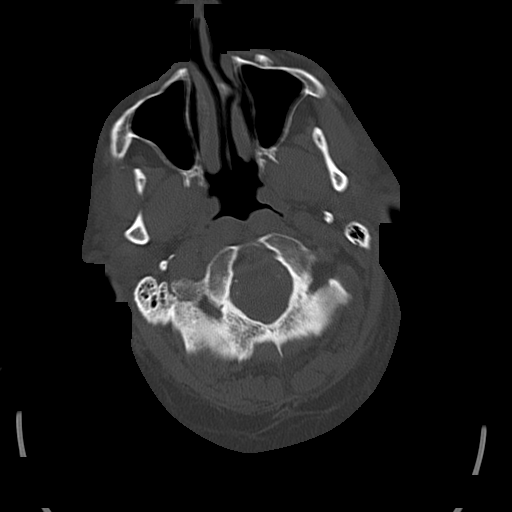
[im 11/47  bone]
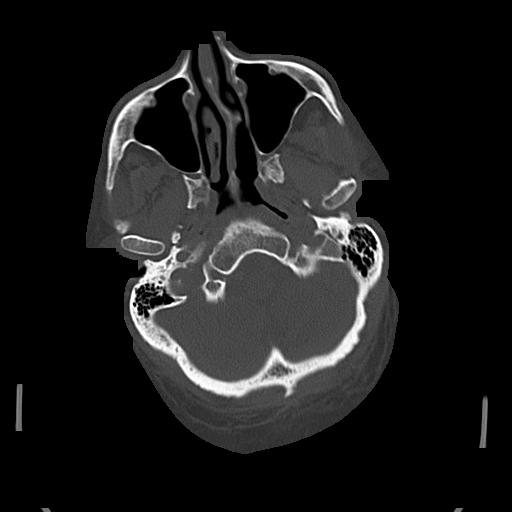
[im 16/47  bone]
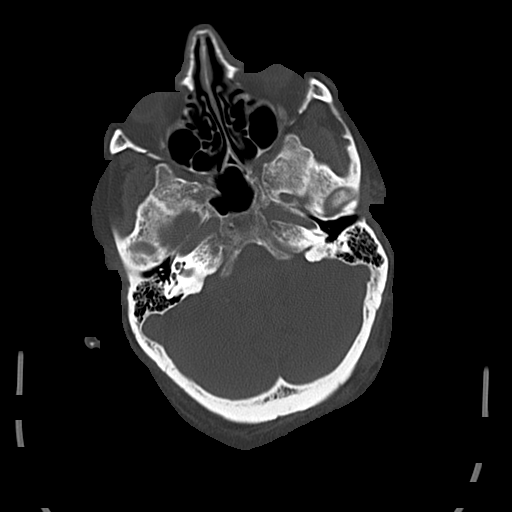
[im 21/47  bone]
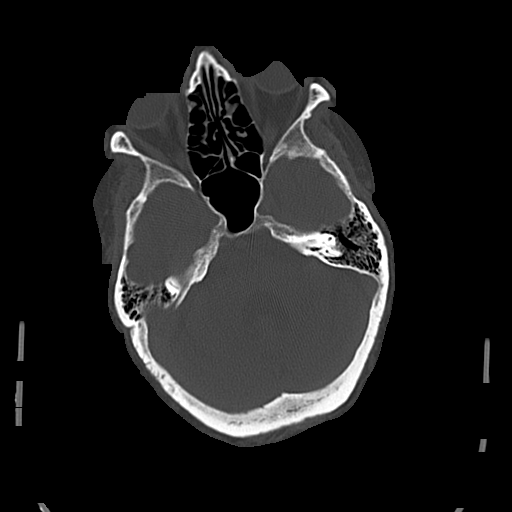

[Series 8: head without cor · coronal · non-contrast · 0.19mm/px · 3 of 71 slices shown]
[im 35/71  brain]
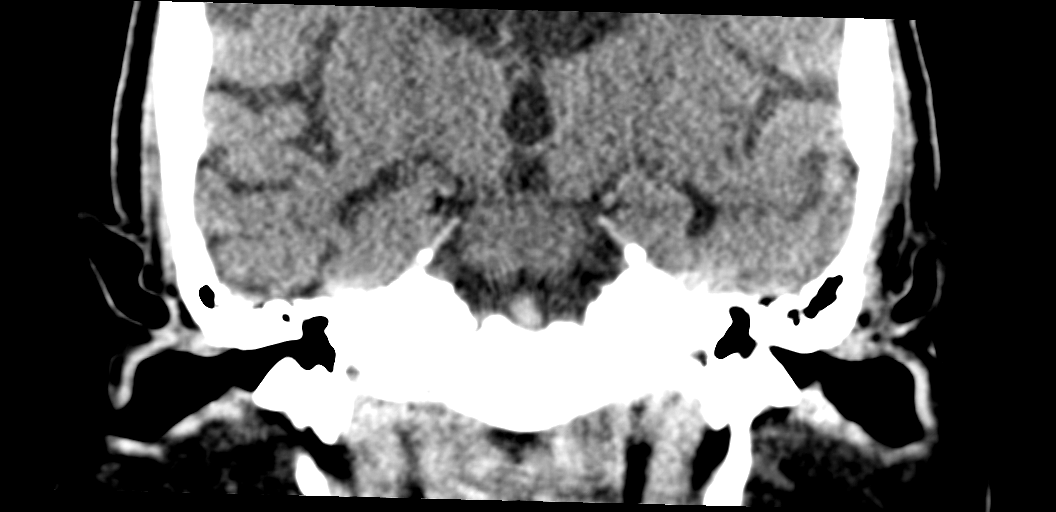
[im 44/71  brain]
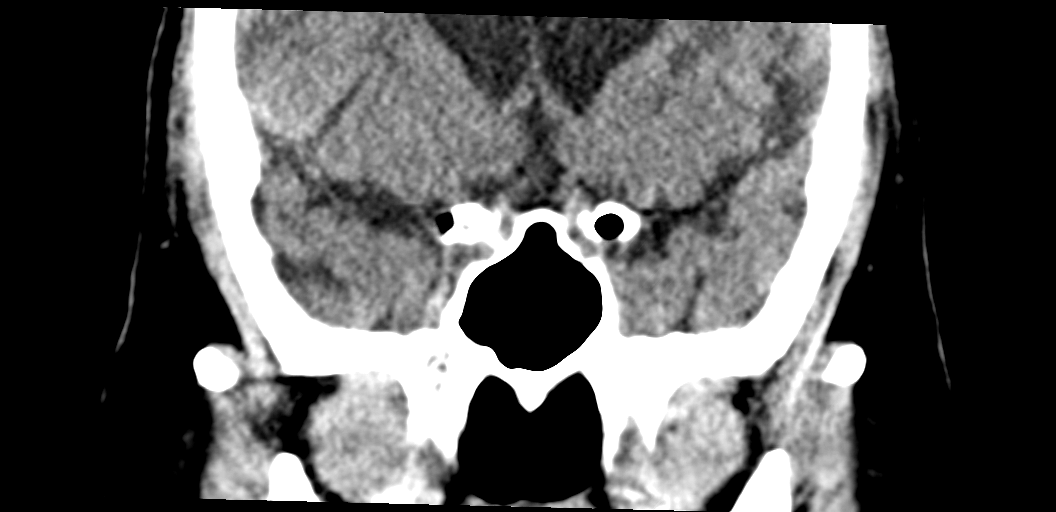
[im 53/71  brain]
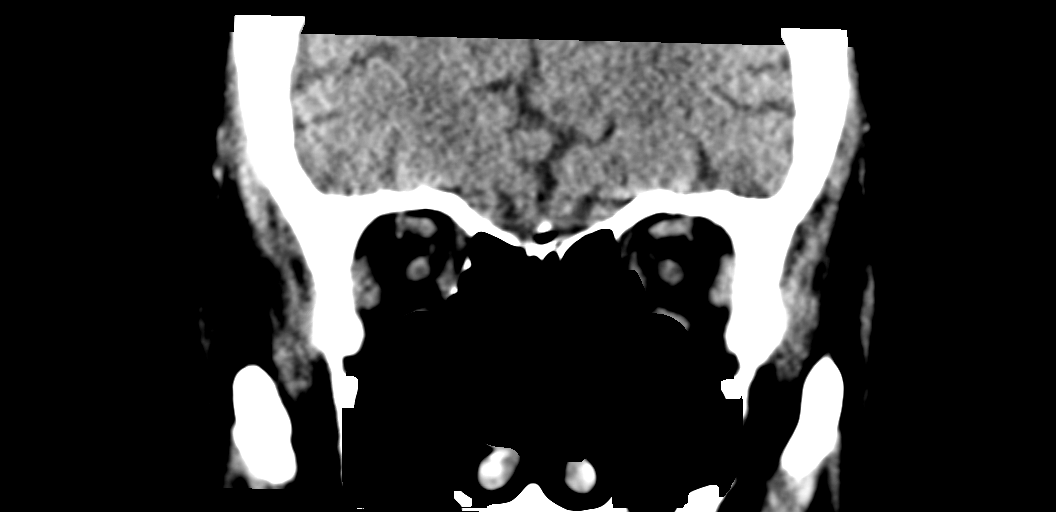

[Series 9: head without sag · sagittal · non-contrast · 0.19mm/px · 2 of 56 slices shown]
[im 19/56  brain]
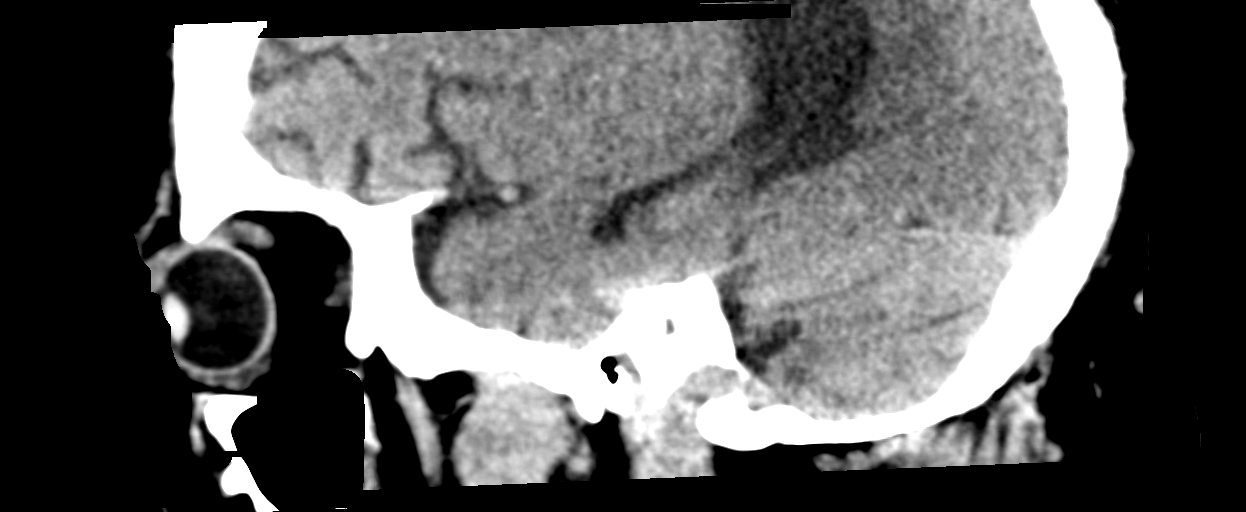
[im 37/56  brain]
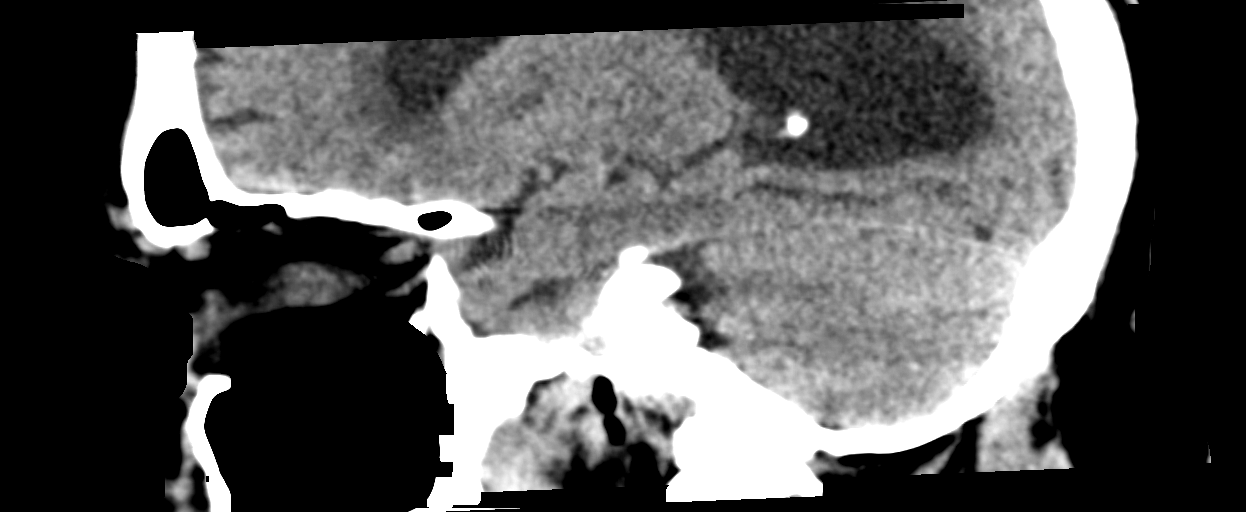

[13 of 47 positions shown; findings below may reference images not displayed]

FINDINGS: Brain: Submental exam performed due to patient motion and technical
issues with CT scanner. This limits assessment. Allowing for this
there is no evidence of hemorrhage, acute ischemia, midline shift or
mass effect. Stable atrophy and periventricular white matter
hypodensity.

Vascular: No hyperdense vessel.

Skull: No skull fracture. Stable sclerotic exostosis left frontal
parietal bone.

Sinuses/Orbits: Paranasal sinuses and mastoid air cells are clear.
The visualized orbits are unremarkable.

Other: None.
IMPRESSION: 1. No acute intracranial abnormality.
2. Stable atrophy and chronic small vessel ischemia.

## 2023-09-09 IMAGING — DX DG ABD PORTABLE 1V
1 series · 1 of 1 positions shown · non-contrast
Comparison: 05/09/2022

CLINICAL DATA: Feeding tube placement.

EXAM:
PORTABLE ABDOMEN - 1 VIEW

[abdomen]
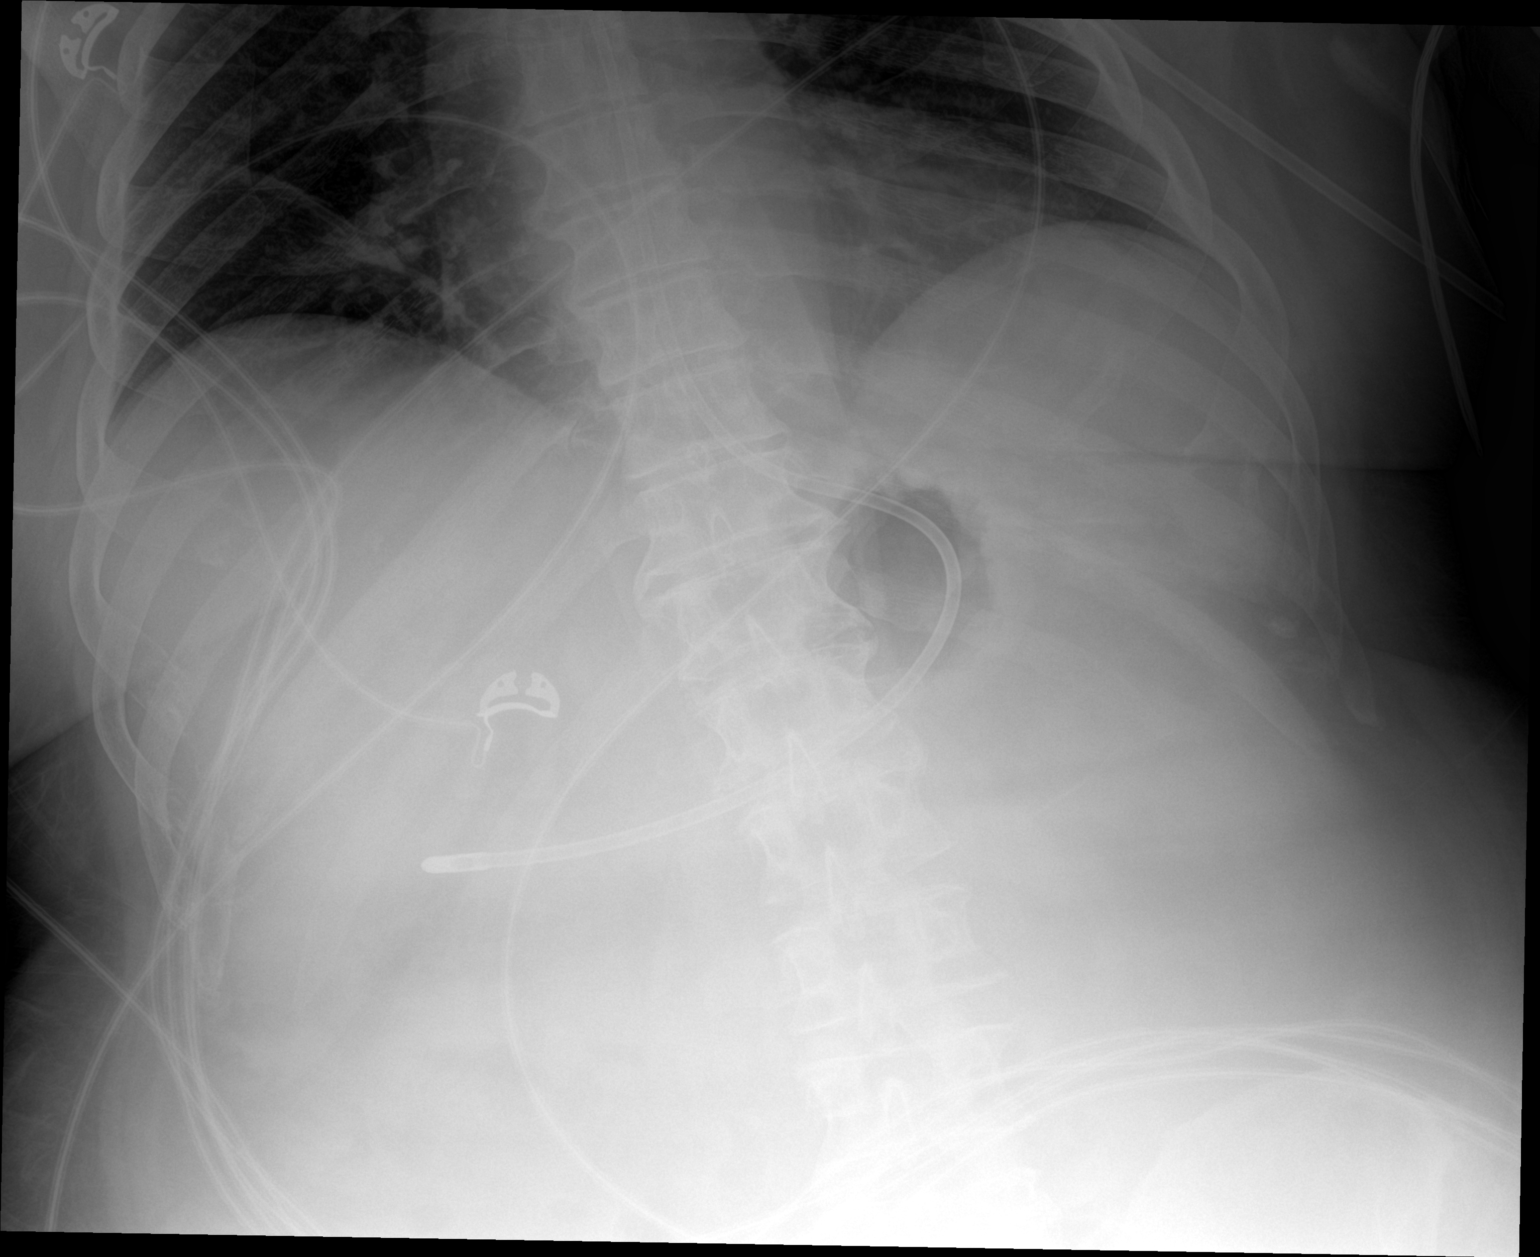

[1 of 1 positions shown; findings below may reference images not displayed]

FINDINGS: A small bore feeding tube is noted with tip overlying the
peripyloric region.

Bowel gas pattern is unremarkable.
IMPRESSION: Small bore feeding tube with tip overlying the peripyloric region.

## 2023-09-20 ENCOUNTER — Other Ambulatory Visit: Payer: Self-pay

## 2023-09-20 ENCOUNTER — Emergency Department (HOSPITAL_COMMUNITY)
Admission: EM | Admit: 2023-09-20 | Discharge: 2023-09-20 | Disposition: A | Discharge status: Home / Self Care | Payer: Medicare (Managed Care) | Attending: Emergency Medicine | Admitting: Emergency Medicine

## 2023-09-20 ENCOUNTER — Encounter (HOSPITAL_COMMUNITY): Payer: Self-pay | Admitting: Emergency Medicine

## 2023-09-20 DIAGNOSIS — Z794 Long term (current) use of insulin: Secondary | ICD-10-CM | POA: Insufficient documentation

## 2023-09-20 DIAGNOSIS — Z515 Encounter for palliative care: Secondary | ICD-10-CM | POA: Diagnosis not present

## 2023-09-20 DIAGNOSIS — I959 Hypotension, unspecified: Secondary | ICD-10-CM | POA: Diagnosis present

## 2023-09-20 DIAGNOSIS — R001 Bradycardia, unspecified: Secondary | ICD-10-CM | POA: Insufficient documentation

## 2023-09-20 DIAGNOSIS — Z79899 Other long term (current) drug therapy: Secondary | ICD-10-CM | POA: Insufficient documentation

## 2023-09-20 NOTE — ED Notes (Signed)
Called ptar unable to give pick up time

## 2023-09-20 NOTE — ED Notes (Signed)
Pt provided with sandwich per provider.

## 2023-09-20 NOTE — ED Triage Notes (Signed)
Pt arrives via EMS from [facility]. EMS states last night pt's BP low, without improvement with IV fluids. EMS states pt started complaining of dizziness and cough starting last night as well. Pt stood to transfer to litter with assistance. Pt arrives with subcutaneous site started by facility, received NS. Pt Aox4.

## 2023-09-20 NOTE — ED Provider Notes (Addendum)
[place] EMERGENCY DEPARTMENT AT [facility] Provider Note   CSN: [id number] Arrival date & time: 09/20/23  0805     History  Chief Complaint  Patient presents with   Hypotension    Erik Turner is a 67 y.o. male.  HPI Patient sent from [facility] nursing center.  Reportedly hypotensive and bradycardic.  Patient states he feels fine.  Patient presents with a MOST form that states he is comfort care and does not want antibiotics IV fluids or feeding tubes.  Discussed with patient and he reaffirms this belief.  Discussed with director of nursing at [facility] on what the reason for the transfer was.  Patient reportedly was hypotensive and bradycardic and they discussed with the physician who said send to the ER.  Reportedly has had recent blood work and x-ray.  Has subcu fluids and abdomen.    Home Medications Prior to Admission medications   Medication Sig Start Date End Date Taking? Authorizing Provider  acetaminophen (TYLENOL) 325 MG tablet Take 2 tablets (650 mg total) by mouth every 6 (six) hours as needed for mild pain (or Fever >/= 101). 03/13/22   Gonfa, Taye T, MD  atorvastatin (LIPITOR) 20 MG tablet Take 1 tablet (20 mg total) by mouth daily. Patient not taking: Reported on 05/06/2022 03/13/22 09/09/22  Almon Hercules, MD  Capsaicin-Menthol (SALONPAS GEL EX) Apply 1 application. topically daily. Right shoulder    [provider]  cholecalciferol (VITAMIN D) 1000 units tablet Take 1 tablet (1,000 Units total) by mouth daily. 03/13/22   Almon Hercules, MD  feeding supplement (ENSURE ENLIVE / ENSURE PLUS) LIQD Take 237 mLs by mouth 3 (three) times daily between meals. 05/14/22   Ghimire, Werner Lean, MD  insulin aspart (NOVOLOG) 100 UNIT/ML injection 0-9 Units, Subcutaneous, 3 times daily with meals CBG < 70: Implement Hypoglycemia measures CBG 70 - 120: 0 units CBG 121 - 150: 1 unit CBG 151 - 200: 2 units CBG 201 - 250: 3 units CBG 251 - 300: 5 units CBG 301  - 350: 7 units CBG 351 - 400: 9 units CBG > 400: call MD 05/14/22   Maretta Bees, MD  melatonin 5 MG TABS Take 1 tablet (5 mg total) by mouth at bedtime. 05/14/22   Ghimire, Werner Lean, MD  metFORMIN (GLUCOPHAGE) 500 MG tablet Take 1 tablet (500 mg total) by mouth 2 (two) times daily with a meal. Patient taking differently: Take 1,000 mg by mouth daily with breakfast. 03/13/22 09/09/22  Almon Hercules, MD  metoprolol tartrate (LOPRESSOR) 25 MG tablet Take 1 tablet (25 mg total) by mouth 2 (two) times daily. 05/14/22   Ghimire, Werner Lean, MD  Multiple Vitamin (MULTIVITAMIN WITH MINERALS) TABS tablet Take 1 tablet by mouth daily. Patient not taking: Reported on 05/06/2022 03/13/22   Almon Hercules, MD  Nystatin (GERHARDT'S BUTT CREAM) CREA Apply 1 application. topically 2 (two) times daily. Add to left thigh, left lower abd near ostomy 05/14/22   Ghimire, Werner Lean, MD  QUEtiapine (SEROQUEL) 50 MG tablet Take 1 tablet (50 mg total) by mouth at bedtime. 05/14/22   Ghimire, Werner Lean, MD  tamsulosin (FLOMAX) 0.4 MG CAPS capsule Take 1 capsule (0.4 mg total) by mouth daily. 05/14/22   Ghimire, Werner Lean, MD      Allergies    Penicillins    Review of Systems   Review of Systems  Physical Exam Updated Vital Signs BP (!) 81/53   Pulse Marland Kitchen)  54   Temp 97.7 F (36.5 C) (Oral)   Resp 17   Ht 5\' 6"  (1.676 m)   Wt 102.2 kg   SpO2 100%   BMI 36.37 kg/m  Physical Exam Vitals reviewed.  Cardiovascular:     Rate and Rhythm: Regular rhythm.  Abdominal:     Tenderness: There is no abdominal tenderness.     Comments: Subcu fluids right abdomen.  Ostomy left lower abdomen.  Neurological:     Mental Status: He is alert and oriented to person, place, and time. Mental status is at baseline.     ED Results / Procedures / Treatments   Labs (all labs ordered are listed, but only abnormal results are displayed) Labs Reviewed - No data to display  EKG EKG Interpretation Date/Time:  Thursday September 20 2023 08:14:51 EDT Ventricular Rate:  55 PR Interval:  231 QRS Duration:  93 QT Interval:  446 QTC Calculation: 427 R Axis:   54  Text Interpretation: Sinus rhythm Prolonged PR interval Low voltage, precordial leads Abnormal inferior Q waves Confirmed by Benjiman Core [phone]) on 09/20/2023 8:29:46 AM  Radiology No results found.  Procedures Procedures    Medications Ordered in ED Medications - No data to display  ED Course/ Medical Decision Making/ A&P                                 Medical Decision Making  Patient sent in.  Patient however is comfort care.  DNR and does not want further treatment.  Discussed with patient and reaffirmed this belief.  Discussed with Interior and spatial designer of nursing at [facility].  Did go along with patient's belief will be discharged back to nursing home.  Patient did request a sandwich and food, which she was given.        Final Clinical Impression(s) / ED Diagnoses Final diagnoses:  Palliative care patient    Rx / DC Orders ED Discharge Orders     None         Benjiman Core, MD 09/20/23 2536    Benjiman Core, MD 09/20/23 [phone]
